# Patient Record
Sex: Male | Born: 1950 | Race: White | Hispanic: No | Marital: Single | Smoking: Never smoker
Health system: Southern US, Community
[De-identification: ages and names within clinical notes are randomized; demographics above are authoritative.]

## PROBLEM LIST (undated history)

## (undated) DIAGNOSIS — I10 Essential (primary) hypertension: Secondary | ICD-10-CM

---

## 2018-03-06 DIAGNOSIS — S20461A Insect bite (nonvenomous) of right back wall of thorax, initial encounter: Secondary | ICD-10-CM | POA: Diagnosis not present

## 2018-04-17 DIAGNOSIS — R7302 Impaired glucose tolerance (oral): Secondary | ICD-10-CM | POA: Diagnosis not present

## 2018-04-17 DIAGNOSIS — I10 Essential (primary) hypertension: Secondary | ICD-10-CM | POA: Diagnosis not present

## 2018-04-17 DIAGNOSIS — E782 Mixed hyperlipidemia: Secondary | ICD-10-CM | POA: Diagnosis not present

## 2018-04-17 DIAGNOSIS — G4733 Obstructive sleep apnea (adult) (pediatric): Secondary | ICD-10-CM | POA: Diagnosis not present

## 2018-04-17 DIAGNOSIS — Z23 Encounter for immunization: Secondary | ICD-10-CM | POA: Diagnosis not present

## 2018-04-21 DIAGNOSIS — E782 Mixed hyperlipidemia: Secondary | ICD-10-CM | POA: Diagnosis not present

## 2018-04-21 DIAGNOSIS — R7302 Impaired glucose tolerance (oral): Secondary | ICD-10-CM | POA: Diagnosis not present

## 2018-05-12 DIAGNOSIS — R7989 Other specified abnormal findings of blood chemistry: Secondary | ICD-10-CM | POA: Diagnosis not present

## 2018-08-08 ENCOUNTER — Emergency Department: Payer: Medicare HMO

## 2018-08-08 DIAGNOSIS — N452 Orchitis: Secondary | ICD-10-CM | POA: Diagnosis not present

## 2018-08-08 DIAGNOSIS — A419 Sepsis, unspecified organism: Secondary | ICD-10-CM | POA: Diagnosis not present

## 2018-08-08 DIAGNOSIS — N453 Epididymo-orchitis: Secondary | ICD-10-CM | POA: Diagnosis present

## 2018-08-08 DIAGNOSIS — N390 Urinary tract infection, site not specified: Secondary | ICD-10-CM | POA: Diagnosis present

## 2018-08-08 DIAGNOSIS — R310 Gross hematuria: Secondary | ICD-10-CM | POA: Diagnosis present

## 2018-08-08 DIAGNOSIS — Z79899 Other long term (current) drug therapy: Secondary | ICD-10-CM

## 2018-08-08 DIAGNOSIS — N433 Hydrocele, unspecified: Secondary | ICD-10-CM | POA: Diagnosis not present

## 2018-08-08 DIAGNOSIS — N5089 Other specified disorders of the male genital organs: Secondary | ICD-10-CM | POA: Diagnosis present

## 2018-08-08 DIAGNOSIS — E785 Hyperlipidemia, unspecified: Secondary | ICD-10-CM | POA: Diagnosis present

## 2018-08-08 DIAGNOSIS — R319 Hematuria, unspecified: Secondary | ICD-10-CM | POA: Diagnosis not present

## 2018-08-08 DIAGNOSIS — I10 Essential (primary) hypertension: Secondary | ICD-10-CM | POA: Diagnosis present

## 2018-08-08 LAB — COMPREHENSIVE METABOLIC PANEL
ALK PHOS: 56 U/L (ref 38–126)
ALT: 16 U/L (ref 0–44)
AST: 21 U/L (ref 15–41)
Albumin: 4.3 g/dL (ref 3.5–5.0)
Anion gap: 8 (ref 5–15)
BILIRUBIN TOTAL: 1 mg/dL (ref 0.3–1.2)
BUN: 19 mg/dL (ref 8–23)
CO2: 28 mmol/L (ref 22–32)
CREATININE: 1.48 mg/dL — AB (ref 0.61–1.24)
Calcium: 9.6 mg/dL (ref 8.9–10.3)
Chloride: 99 mmol/L (ref 98–111)
GFR calc non Af Amer: 48 mL/min — ABNORMAL LOW (ref >60)
GFR, EST AFRICAN AMERICAN: 56 mL/min — AB (ref >60)
GLUCOSE: 149 mg/dL — AB (ref 70–99)
Potassium: 3.9 mmol/L (ref 3.5–5.1)
SODIUM: 135 mmol/L (ref 135–145)
TOTAL PROTEIN: 7.5 g/dL (ref 6.5–8.1)

## 2018-08-08 LAB — URINALYSIS, COMPLETE (UACMP) WITH MICROSCOPIC
Bilirubin Urine: NEGATIVE
Glucose, UA: NEGATIVE mg/dL
Ketones, ur: NEGATIVE mg/dL
NITRITE: NEGATIVE
PH: 6 (ref 5.0–8.0)
Protein, ur: NEGATIVE mg/dL
Specific Gravity, Urine: 1.013 (ref 1.005–1.030)

## 2018-08-08 LAB — CBC
HCT: 43.6 % (ref 40.0–52.0)
Hemoglobin: 15.1 g/dL (ref 13.0–18.0)
MCH: 29.9 pg (ref 26.0–34.0)
MCHC: 34.6 g/dL (ref 32.0–36.0)
MCV: 86.5 fL (ref 80.0–100.0)
PLATELETS: 236 10*3/uL (ref 150–440)
RBC: 5.04 MIL/uL (ref 4.40–5.90)
RDW: 14 % (ref 11.5–14.5)
WBC: 16.4 10*3/uL — ABNORMAL HIGH (ref 3.8–10.6)

## 2018-08-08 LAB — LIPASE, BLOOD: Lipase: 30 U/L (ref 11–51)

## 2018-08-08 MED ORDER — OXYCODONE-ACETAMINOPHEN 5-325 MG PO TABS
1.0000 | ORAL_TABLET | Freq: Once | ORAL | Status: AC
  Administered 2018-08-08: 1 via ORAL
  Filled 2018-08-08: qty 1

## 2018-08-08 NOTE — ED Triage Notes (Signed)
Pt c/o hematuria and right scrotum pain x1 day. Pt denies dysuria and denies frequency.

## 2018-08-09 ENCOUNTER — Other Ambulatory Visit: Payer: Self-pay

## 2018-08-09 ENCOUNTER — Inpatient Hospital Stay
Admission: EM | Admit: 2018-08-09 | Discharge: 2018-08-10 | DRG: 872 | Disposition: A | Discharge status: Home / Self Care | Payer: Medicare HMO | Attending: Specialist | Admitting: Specialist

## 2018-08-09 DIAGNOSIS — N452 Orchitis: Secondary | ICD-10-CM | POA: Diagnosis not present

## 2018-08-09 DIAGNOSIS — N5089 Other specified disorders of the male genital organs: Secondary | ICD-10-CM

## 2018-08-09 DIAGNOSIS — E785 Hyperlipidemia, unspecified: Secondary | ICD-10-CM | POA: Diagnosis not present

## 2018-08-09 DIAGNOSIS — N390 Urinary tract infection, site not specified: Secondary | ICD-10-CM | POA: Diagnosis present

## 2018-08-09 DIAGNOSIS — Z79899 Other long term (current) drug therapy: Secondary | ICD-10-CM | POA: Diagnosis not present

## 2018-08-09 DIAGNOSIS — N431 Infected hydrocele: Secondary | ICD-10-CM

## 2018-08-09 DIAGNOSIS — N433 Hydrocele, unspecified: Secondary | ICD-10-CM | POA: Diagnosis present

## 2018-08-09 DIAGNOSIS — N453 Epididymo-orchitis: Secondary | ICD-10-CM | POA: Diagnosis present

## 2018-08-09 DIAGNOSIS — R310 Gross hematuria: Secondary | ICD-10-CM | POA: Diagnosis present

## 2018-08-09 DIAGNOSIS — I10 Essential (primary) hypertension: Secondary | ICD-10-CM | POA: Diagnosis not present

## 2018-08-09 DIAGNOSIS — A419 Sepsis, unspecified organism: Secondary | ICD-10-CM | POA: Diagnosis not present

## 2018-08-09 HISTORY — DX: Essential (primary) hypertension: I10

## 2018-08-09 LAB — LACTIC ACID, PLASMA
Lactic Acid, Venous: 1.3 mmol/L (ref 0.5–1.9)
Lactic Acid, Venous: 1.2 mmol/L (ref 0.5–1.9)

## 2018-08-09 LAB — MRSA PCR SCREENING: MRSA by PCR: NEGATIVE

## 2018-08-09 LAB — HEMOGLOBIN A1C
Hgb A1c MFr Bld: 6.1 % — ABNORMAL HIGH (ref 4.8–5.6)
Mean Plasma Glucose: 128.37 mg/dL

## 2018-08-09 LAB — TSH: TSH: 0.952 u[IU]/mL (ref 0.350–4.500)

## 2018-08-09 MED ORDER — SODIUM CHLORIDE 0.9 % IV SOLN
2.0000 g | INTRAVENOUS | Status: DC
  Administered 2018-08-09: 2 g via INTRAVENOUS
  Filled 2018-08-09 (×2): qty 20

## 2018-08-09 MED ORDER — HYDROCHLOROTHIAZIDE 25 MG PO TABS: 25.0000 mg | ORAL_TABLET | Freq: Every day | ORAL | Status: DC

## 2018-08-09 MED ORDER — VANCOMYCIN HCL IN DEXTROSE 1-5 GM/200ML-% IV SOLN
1000.0000 mg | Freq: Two times a day (BID) | INTRAVENOUS | Status: DC
  Filled 2018-08-09 (×2): qty 200

## 2018-08-09 MED ORDER — SODIUM CHLORIDE 0.9 % IV BOLUS
1000.0000 mL | Freq: Once | INTRAVENOUS | Status: AC
  Administered 2018-08-09: 1000 mL via INTRAVENOUS

## 2018-08-09 MED ORDER — MORPHINE SULFATE (PF) 4 MG/ML IV SOLN
4.0000 mg | Freq: Once | INTRAVENOUS | Status: AC
  Administered 2018-08-09: 4 mg via INTRAVENOUS
  Filled 2018-08-09: qty 1

## 2018-08-09 MED ORDER — ONDANSETRON HCL 4 MG/2ML IJ SOLN
4.0000 mg | Freq: Once | INTRAMUSCULAR | Status: AC
  Administered 2018-08-09: 4 mg via INTRAVENOUS
  Filled 2018-08-09: qty 2

## 2018-08-09 MED ORDER — ENOXAPARIN SODIUM 40 MG/0.4ML ~~LOC~~ SOLN
40.0000 mg | SUBCUTANEOUS | Status: DC
  Filled 2018-08-09: qty 0.4

## 2018-08-09 MED ORDER — VANCOMYCIN HCL IN DEXTROSE 1-5 GM/200ML-% IV SOLN
1000.0000 mg | Freq: Once | INTRAVENOUS | Status: AC
  Administered 2018-08-09: 1000 mg via INTRAVENOUS
  Filled 2018-08-09: qty 200

## 2018-08-09 MED ORDER — ONDANSETRON HCL 4 MG/2ML IJ SOLN: 4.0000 mg | Freq: Four times a day (QID) | INTRAMUSCULAR | Status: DC | PRN

## 2018-08-09 MED ORDER — ACETAMINOPHEN 325 MG PO TABS
650.0000 mg | ORAL_TABLET | Freq: Four times a day (QID) | ORAL | Status: DC | PRN
  Administered 2018-08-09: 21:00:00 650 mg via ORAL
  Filled 2018-08-09: qty 2

## 2018-08-09 MED ORDER — HYDROMORPHONE HCL 1 MG/ML IJ SOLN
0.5000 mg | INTRAMUSCULAR | Status: DC | PRN
  Administered 2018-08-09: 0.5 mg via INTRAVENOUS
  Filled 2018-08-09: qty 1

## 2018-08-09 MED ORDER — LOSARTAN POTASSIUM-HCTZ 100-25 MG PO TABS: 1.0000 | ORAL_TABLET | Freq: Every day | ORAL | Status: DC

## 2018-08-09 MED ORDER — ACETAMINOPHEN 650 MG RE SUPP: 650.0000 mg | Freq: Four times a day (QID) | RECTAL | Status: DC | PRN

## 2018-08-09 MED ORDER — DOCUSATE SODIUM 100 MG PO CAPS
100.0000 mg | ORAL_CAPSULE | Freq: Two times a day (BID) | ORAL | Status: DC
  Administered 2018-08-09 – 2018-08-10 (×3): 100 mg via ORAL
  Filled 2018-08-09 (×3): qty 1

## 2018-08-09 MED ORDER — ROSUVASTATIN CALCIUM 10 MG PO TABS
5.0000 mg | ORAL_TABLET | Freq: Every day | ORAL | Status: DC
  Administered 2018-08-09: 5 mg via ORAL
  Filled 2018-08-09: qty 1

## 2018-08-09 MED ORDER — LOSARTAN POTASSIUM 50 MG PO TABS: 100.0000 mg | ORAL_TABLET | Freq: Every day | ORAL | Status: DC

## 2018-08-09 MED ORDER — LEVOFLOXACIN IN D5W 500 MG/100ML IV SOLN
500.0000 mg | INTRAVENOUS | Status: DC
  Administered 2018-08-09 – 2018-08-10 (×2): 500 mg via INTRAVENOUS
  Filled 2018-08-09 (×2): qty 100

## 2018-08-09 MED ORDER — ONDANSETRON HCL 4 MG PO TABS: 4.0000 mg | ORAL_TABLET | Freq: Four times a day (QID) | ORAL | Status: DC | PRN

## 2018-08-09 NOTE — ED Provider Notes (Signed)
[facility] Emergency Department Provider Note ____________________________________________   First MD Initiated Contact with Patient 08/09/18 0157     (approximate)  I have reviewed the triage vital signs and the nursing notes.   HISTORY  Chief Complaint Groin Swelling and Hematuria    HPI Tom [name] is a 67 y.o. male who presents with hematuria and scrotal swelling over the last 2 to 3 days.  Patient reports initially he had acute onset of hematuria, and then gradually developed right-sided scrotal swelling and pain which has been worsening.  He denies dysuria, flank pain, or vomiting.  No prior history of similar symptoms.  No known trauma to the scrotum.  The patient states that he has not been sexually active in some time.  No past medical history on file.  There are no active problems to display for this patient.     Prior to Admission medications   Medication Sig Start Date End Date Taking? Authorizing Provider  losartan-hydrochlorothiazide (HYZAAR) 100-25 MG tablet Take 1 tablet by mouth daily. 07/12/18  Yes [provider]  rosuvastatin (CRESTOR) 5 MG tablet Take 5 mg by mouth daily. 07/23/18  Yes [provider]    Allergies Patient has no known allergies.  No family history on file.  Social History Social History   Tobacco Use  . Smoking status: Not on file  Substance Use Topics  . Alcohol use: Not on file  . Drug use: Not on file    Review of Systems  Constitutional: Positive for fever. Eyes: No redness. ENT: No sore throat. Cardiovascular: Denies chest pain. Respiratory: Denies shortness of breath. Gastrointestinal: No vomiting.  Genitourinary: Positive for hematuria.  Musculoskeletal: Negative for back pain. Skin: Negative for rash. Neurological: Negative for headache.   ____________________________________________   PHYSICAL EXAM:  VITAL SIGNS: ED Triage Vitals  Enc Vitals Group   BP 08/08/18 2139 128/76     Pulse Rate 08/08/18 2139 (!) 119     Resp 08/08/18 2139 18     Temp 08/08/18 2139 99.3 F (37.4 C)     Temp Source 08/08/18 2139 Oral     SpO2 08/08/18 2139 100 %     Weight 08/08/18 2139 206 lb (93.4 kg)     Height 08/08/18 2139 5\' 9"  (1.753 m)     Head Circumference --      Peak Flow --      Pain Score 08/09/18 0208 7     Pain Loc --      Pain Edu? --      Excl. in GC? --     Constitutional: Alert and oriented.  Relatively well appearing and in no acute distress. Eyes: Conjunctivae are normal.  Head: Atraumatic. Nose: No congestion/rhinnorhea. Mouth/Throat: Mucous membranes are slightly dry.   Neck: Normal range of motion.  Cardiovascular: Tachycardic, regular rhythm.  Good peripheral circulation. Respiratory: Normal respiratory effort.  No retractions. Gastrointestinal: Soft and nontender. No distention.  Genitourinary: Scrotal swelling R>L with some induration and tenderness.  Inguinal area, inner thigh, and perineum with no erythema, induration, swelling, or warmth. Musculoskeletal:  Extremities warm and well perfused.  Neurologic:  Normal speech and language. No gross focal neurologic deficits are appreciated.  Skin:  Skin is warm and dry. No rash noted. Psychiatric: Mood and affect are normal. Speech and behavior are normal.  ____________________________________________   LABS (all labs ordered are listed, but only abnormal results are displayed)  Labs Reviewed  COMPREHENSIVE METABOLIC PANEL - Abnormal; Notable for the  following components:      Result Value   Glucose, Bld 149 (*)    Creatinine, Ser 1.48 (*)    GFR calc non Af Amer 48 (*)    GFR calc Af Amer 56 (*)    All other components within normal limits  CBC - Abnormal; Notable for the following components:   WBC 16.4 (*)    All other components within normal limits  URINALYSIS, COMPLETE (UACMP) WITH MICROSCOPIC - Abnormal; Notable for the following components:   Color, Urine  YELLOW (*)    APPearance CLEAR (*)    Hgb urine dipstick SMALL (*)    Leukocytes, UA MODERATE (*)    Bacteria, UA RARE (*)    All other components within normal limits  CULTURE, BLOOD (ROUTINE X 2)  CULTURE, BLOOD (ROUTINE X 2)  LIPASE, BLOOD  LACTIC ACID, PLASMA  LACTIC ACID, PLASMA   ____________________________________________  EKG   ____________________________________________  RADIOLOGY  US scrotum: Orchitis with right-sided complex scrotal fluid, possibly pyocele  ____________________________________________   PROCEDURES  Procedure(s) performed: No  Procedures  Critical Care performed: No ____________________________________________   INITIAL IMPRESSION / ASSESSMENT AND PLAN / ED COURSE  Pertinent labs & imaging results that were available during my care of the patient were reviewed by me and considered in my medical decision making (see chart for details).  67 year old male with prior history of congenital urologic abnormality (stricture?) but no BPH or other active urological problems presents with hematuria over the last 2 days, with scrotal swelling and pain mainly on the right side.  No prior history of the symptoms.  On exam, the patient is slightly uncomfortable but relatively well-appearing, he has a borderline temperature and is somewhat tachycardic, but other vital signs are normal.  He has scrotal swelling and tenderness and induration on the right side.   Ultrasound obtained from triage shows findings consistent with orchitis, but also with appearance of possible pyocele on the right.  On exam, there is no evidence of involvement of the rest of the scrotum, the inguinal area or perineum.  There is no evidence of Fournier's gangrene or other necrotizing soft tissue infection.  I consulted Dr. Richardo Hanks from urology.  He advises that based on the patient's clinical picture and presentation, there is no indication for emergent drainage or other emergent  procedure, but agrees with my plan to start empiric antibiotics and admit.  Patient will be evaluated by urology in the morning.  ----------------------------------------- 4:16 AM on 08/09/2018 -----------------------------------------  Lactate is negative.  Vital signs are improving after fluids.  Antibiotic's have been started.  I signed the patient out to the hospitalist Dr. Sheryle Hail. ____________________________________________   FINAL CLINICAL IMPRESSION(S) / ED DIAGNOSES  Final diagnoses:  Orchitis  Pyocele      NEW MEDICATIONS STARTED DURING THIS VISIT:  New Prescriptions   No medications on file     Note:  This document was prepared using Dragon voice recognition software and may include unintentional dictation errors.    Dionne Bucy, MD 08/09/18 [phone]

## 2018-08-09 NOTE — ED Notes (Signed)
Admit Provider at bedside. 

## 2018-08-09 NOTE — H&P (Signed)
Tom [name] is an 67 y.o. male.   Chief Complaint: Hematuria HPI: The patient with past medical history of hypertension presents to the emergency department complaining of blood in his urine.  He reports that he first noticed blood in his urine 2 days ago.  Since that time his urine is cleared slightly but his testicles have begun to hurt.  The pain radiates into his inguinal/suprapubic area.  He denies fever, nausea or vomiting.  Ultrasound of his testicles in the emergency department was consistent with orchitis which prompted the emergency department staff called the hospitalist service for admission.  Past Medical History:  Diagnosis Date  . Hypertension     History reviewed. No pertinent surgical history. None  History reviewed. No pertinent family history. Hypertension in others   Social History:  reports that he has never smoked. He has never used smokeless tobacco. He reports that he drinks alcohol. His drug history is not on file.  Allergies: No Known Allergies  Medications Prior to Admission  Medication Sig Dispense Refill  . losartan-hydrochlorothiazide (HYZAAR) 100-25 MG tablet Take 1 tablet by mouth daily.    . rosuvastatin (CRESTOR) 5 MG tablet Take 5 mg by mouth daily.      Results for orders placed or performed during the hospital encounter of 08/09/18 (from the past 48 hour(s))  Lipase, blood     Status: None   Collection Time: 08/08/18  9:50 PM  Result Value Ref Range   Lipase 30 11 - 51 U/L    Comment: Performed at [facility], [address] [place], [place] [address]  Comprehensive metabolic panel     Status: Abnormal   Collection Time: 08/08/18  9:50 PM  Result Value Ref Range   Sodium 135 135 - 145 mmol/L   Potassium 3.9 3.5 - 5.1 mmol/L   Chloride 99 98 - 111 mmol/L   CO2 28 22 - 32 mmol/L   Glucose, Bld 149 (H) 70 - 99 mg/dL   BUN 19 8 - 23 mg/dL   Creatinine, Ser 1.48 (H) 0.61 - 1.24 mg/dL   Calcium 9.6 8.9 - 10.3 mg/dL   Total  Protein 7.5 6.5 - 8.1 g/dL   Albumin 4.3 3.5 - 5.0 g/dL   AST 21 15 - 41 U/L   ALT 16 0 - 44 U/L   Alkaline Phosphatase 56 38 - 126 U/L   Total Bilirubin 1.0 0.3 - 1.2 mg/dL   GFR calc non Af Amer 48 (L) >60 mL/min   GFR calc Af Amer 56 (L) >60 mL/min    Comment: (NOTE) The eGFR has been calculated using the CKD EPI equation. This calculation has not been validated in all clinical situations. eGFR's persistently <60 mL/min signify possible Chronic Kidney Disease.    Anion gap 8 5 - 15    Comment: Performed at [facility], [address] [place], [place] [address]  CBC     Status: Abnormal   Collection Time: 08/08/18  9:50 PM  Result Value Ref Range   WBC 16.4 (H) 3.8 - 10.6 K/uL   RBC 5.04 4.40 - 5.90 MIL/uL   Hemoglobin 15.1 13.0 - 18.0 g/dL   HCT 43.6 40.0 - 52.0 %   MCV 86.5 80.0 - 100.0 fL   MCH 29.9 26.0 - 34.0 pg   MCHC 34.6 32.0 - 36.0 g/dL   RDW 14.0 11.5 - 14.5 %   Platelets 236 150 - 440 K/uL    Comment: Performed at [facility],  [place], [place] [address]  Urinalysis, Complete w Microscopic     Status: Abnormal   Collection Time: 08/08/18  9:54 PM  Result Value Ref Range   Color, Urine YELLOW (A) YELLOW   APPearance CLEAR (A) CLEAR   Specific Gravity, Urine 1.013 1.005 - 1.030   pH 6.0 5.0 - 8.0   Glucose, UA NEGATIVE NEGATIVE mg/dL   Hgb urine dipstick SMALL (A) NEGATIVE   Bilirubin Urine NEGATIVE NEGATIVE   Ketones, ur NEGATIVE NEGATIVE mg/dL   Protein, ur NEGATIVE NEGATIVE mg/dL   Nitrite NEGATIVE NEGATIVE   Leukocytes, UA MODERATE (A) NEGATIVE   RBC / HPF 0-5 0 - 5 RBC/hpf   WBC, UA 21-50 0 - 5 WBC/hpf   Bacteria, UA RARE (A) NONE SEEN   Squamous Epithelial / LPF 0-5 0 - 5   Mucus PRESENT     Comment: Performed at [facility], [address] [place], [place] [address]  Lactic acid, plasma     Status: None   Collection Time: 08/09/18  2:25 AM  Result Value Ref Range   Lactic Acid, Venous 1.3 0.5 - 1.9  mmol/L    Comment: Performed at [facility], [address] [place], [place] [address]  Lactic acid, plasma     Status: None   Collection Time: 08/09/18  5:48 AM  Result Value Ref Range   Lactic Acid, Venous 1.2 0.5 - 1.9 mmol/L    Comment: Performed at [facility], [address] [place], [place] [address]   US Scrotum W/doppler  Result Date: 08/08/2018 CLINICAL DATA:  RIGHT testicular swelling and pain for 1 day, gross hematuria yesterday EXAM: SCROTAL ULTRASOUND DOPPLER ULTRASOUND OF THE TESTICLES TECHNIQUE: Complete ultrasound examination of the testicles, epididymis, and other scrotal structures was performed. Color and spectral Doppler ultrasound were also utilized to evaluate blood flow to the testicles. COMPARISON:  None FINDINGS: Right testicle Measurements: 4.8 x 3.5 x 3.4 cm. Minimally heterogeneous. No focal mass lesion or calcification. Internal blood flow present on color Doppler imaging, question mildly hypervascular. Left testicle Measurements: 4.4 x 2.3 x 3.1 cm. Normal morphology without mass. Two tiny calcifications noted. Internal blood flow present on color Doppler imaging question mildly hypervascular Right epididymis:  Normal in size and appearance. Left epididymis:  Normal in size and appearance. Hydrocele: Small simple LEFT hydrocele. Large complicated RIGHT hydrocele containing multiple septations and scattered internal echogenicity/debris, could represent pyocele or hematocele. Probable 4 mm calcification within X RIGHT hydrocele inferiorly. Varicocele:  LEFT varicocele present. No RIGHT varicocele. Pulsed Doppler interrogation of both testes demonstrates normal low resistance arterial and venous waveforms bilaterally. IMPRESSION: No evidence of testicular mass or torsion. Mildly hypervascular appearing testes bilaterally which could reflect a orchitis. Complicated RIGHT hydrocele collection with multiple septations and debris raising question of a pyocele  or hematocele. LEFT varicocele present. Electronically Signed   By: Lavonia Dana M.D.   On: 08/08/2018 23:48    Review of Systems  Constitutional: Negative for chills and fever.  HENT: Negative for sore throat and tinnitus.   Eyes: Negative for blurred vision and redness.  Respiratory: Negative for cough and shortness of breath.   Cardiovascular: Negative for chest pain, palpitations, orthopnea and PND.  Gastrointestinal: Negative for abdominal pain, diarrhea, nausea and vomiting.  Genitourinary: Positive for hematuria. Negative for dysuria, frequency and urgency.  Musculoskeletal: Negative for joint pain and myalgias.  Skin: Negative for rash.       No lesions  Neurological: Negative  for speech change, focal weakness and weakness.  Endo/Heme/Allergies: Does not bruise/bleed easily.       No temperature intolerance  Psychiatric/Behavioral: Negative for depression and suicidal ideas.    Blood pressure 103/73, pulse 97, temperature 98.4 F (36.9 C), temperature source Oral, resp. rate 19, height _0  (1.753 m), weight 99.6 kg, SpO2 97 %. Physical Exam  Vitals reviewed. Constitutional: He is oriented to person, place, and time. He appears well-developed and well-nourished. No distress.  HENT:  Head: Normocephalic and atraumatic.  Mouth/Throat: Oropharynx is clear and moist.  Eyes: Pupils are equal, round, and reactive to light. Conjunctivae and EOM are normal. No scleral icterus.  Neck: Normal range of motion. Neck supple. No JVD present. No tracheal deviation present. No thyromegaly present.  Cardiovascular: Normal rate, regular rhythm and normal heart sounds. Exam reveals no gallop and no friction rub.  No murmur heard. Respiratory: Effort normal and breath sounds normal. No respiratory distress.  GI: Soft. Bowel sounds are normal. He exhibits no distension. There is no tenderness.  Genitourinary:  Genitourinary Comments: Deferred  Musculoskeletal: Normal range of motion. He  exhibits no edema.  Lymphadenopathy:    He has no cervical adenopathy.  Neurological: He is alert and oriented to person, place, and time. No cranial nerve deficit.  Skin: Skin is warm and dry. No rash noted. No erythema.  Psychiatric: He has a normal mood and affect. His behavior is normal. Judgment and thought content normal.     Assessment/Plan This is a 67 year old male admitted for orchitis. 1.  Orchitis: Etiology viral versus bacterial.  Currently on vancomycin and ceftriaxone.  No indication of Fournier's gangrene. Patient may also have a small pyocele.  Consult urology. 2.  Sepsis: The patient meets criteria via tachycardia and leukocytosis.  He is hemodynamically stable.  Follow blood cultures for growth and sensitivities. 3.  Hypertension: Controlled; continue hydrochlorothiazide and losartan 4.  Lipidemia: Continue statin therapy 5.  DVT prophylaxis: Ambulation 6.  GI prophylaxis: None The patient is a full code.  Time spent on admission orders and patient care approximately 45 minutes  Harrie Foreman, MD 08/09/2018, 6:37 AM

## 2018-08-09 NOTE — Progress Notes (Signed)
Pharmacy Antibiotic Note  Tom [name] is a 67 y.o. male admitted on 08/09/2018 with cellulitis.  Pharmacy has been consulted for vancomycin dosing. Patient received vanc 1g IV x 1 in ED @ 0400  Plan: Will continue vanc 1g IV q12h w/ 6 hour stack  Will draw vanc trough 09/12 @ 2100 prior to 4th dose. Patient is also on ceftriaxone 2g IV daily.  Ke 0.0510 T1/2 13.5 ~ 12 hrs Goal trough 15 - 20 mcg/mL  Height: 5\' 9"  (175.3 cm) Weight: 206 lb (93.4 kg) IBW/kg (Calculated) : 70.7  Temp (24hrs), Avg:99.3 F (37.4 C), Min:99.3 F (37.4 C), Max:99.3 F (37.4 C)  Recent Labs  Lab 08/08/18 2150 08/09/18 0225  WBC 16.4*  --   CREATININE 1.48*  --   LATICACIDVEN  --  1.3    Estimated Creatinine Clearance: 56.2 mL/min (A) (by C-G formula based on SCr of 1.48 mg/dL (H)).    No Known Allergies  Thank you for allowing pharmacy to be a part of this patient's care.  Tom [name], PharmD, BCPS Clinical Pharmacist 08/09/2018

## 2018-08-09 NOTE — Progress Notes (Signed)
[facility] [place] [facility]   PATIENT NAME: Tom [name]    MR#:  [id number]  DATE OF BIRTH:  [birth year]  SUBJECTIVE:   Patient presented to the hospital due to right groin swelling and redness and noted to have orchitis.  Feels much better since being on some IV antibiotics.  Seen by urology and no plans for surgical intervention.  REVIEW OF SYSTEMS:    Review of Systems  Constitutional: Negative for chills and fever.  HENT: Negative for congestion and tinnitus.   Eyes: Negative for blurred vision and double vision.  Respiratory: Negative for cough, shortness of breath and wheezing.   Cardiovascular: Negative for chest pain, orthopnea and PND.  Gastrointestinal: Negative for abdominal pain, diarrhea, nausea and vomiting.  Genitourinary: Negative for dysuria and hematuria.  Neurological: Negative for dizziness, sensory change and focal weakness.  All other systems reviewed and are negative.   Nutrition: Heart Healthy Tolerating Diet: Yes Tolerating PT: Ambulatory  DRUG ALLERGIES:  No Known Allergies  VITALS:  Blood pressure (!) 95/59, pulse (!) 101, temperature 98.7 F (37.1 C), temperature source Oral, resp. rate 19, height 5\' 9"  (1.753 m), weight 99.6 kg, SpO2 97 %.  PHYSICAL EXAMINATION:   Physical Exam  GENERAL:  67 y.o.-year-old patient lying in bed in no acute distress.  EYES: Pupils equal, round, reactive to light and accommodation. No scleral icterus. Extraocular muscles intact.  HEENT: Head atraumatic, normocephalic. Oropharynx and nasopharynx clear.  NECK:  Supple, no jugular venous distention. No thyroid enlargement, no tenderness.  LUNGS: Normal breath sounds bilaterally, no wheezing, rales, rhonchi. No use of accessory muscles of respiration.  CARDIOVASCULAR: S1, S2 normal. No murmurs, rubs, or gallops.  ABDOMEN: Soft, nontender, nondistended. Bowel sounds present. No organomegaly or mass.  EXTREMITIES: No cyanosis, clubbing or  edema b/l.    NEUROLOGIC: Cranial nerves II through XII are intact. No focal Motor or sensory deficits b/l.   PSYCHIATRIC: The patient is alert and oriented x 3.  SKIN: No obvious rash, lesion, or ulcer.   GU - Scrotal redness and swelling.  Right scrotal swelling > left. No penile discharge.    LABORATORY PANEL:   CBC Recent Labs  Lab 08/08/18 2150  WBC 16.4*  HGB 15.1  HCT 43.6  PLT 236   ------------------------------------------------------------------------------------------------------------------  Chemistries  Recent Labs  Lab 08/08/18 2150  NA 135  K 3.9  CL 99  CO2 28  GLUCOSE 149*  BUN 19  CREATININE 1.48*  CALCIUM 9.6  AST 21  ALT 16  ALKPHOS 56  BILITOT 1.0   ------------------------------------------------------------------------------------------------------------------  Cardiac Enzymes No results for input(s): TROPONINI in the last 168 hours. ------------------------------------------------------------------------------------------------------------------  RADIOLOGY:  US Scrotum W/doppler  Result Date: 08/08/2018 CLINICAL DATA:  RIGHT testicular swelling and pain for 1 day, gross hematuria yesterday EXAM: SCROTAL ULTRASOUND DOPPLER ULTRASOUND OF THE TESTICLES TECHNIQUE: Complete ultrasound examination of the testicles, epididymis, and other scrotal structures was performed. Color and spectral Doppler ultrasound were also utilized to evaluate blood flow to the testicles. COMPARISON:  None FINDINGS: Right testicle Measurements: 4.8 x 3.5 x 3.4 cm. Minimally heterogeneous. No focal mass lesion or calcification. Internal blood flow present on color Doppler imaging, question mildly hypervascular. Left testicle Measurements: 4.4 x 2.3 x 3.1 cm. Normal morphology without mass. Two tiny calcifications noted. Internal blood flow present on color Doppler imaging question mildly hypervascular Right epididymis:  Normal in size and appearance. Left epididymis:  Normal  in size and appearance. Hydrocele: Small simple LEFT hydrocele.  Large complicated RIGHT hydrocele containing multiple septations and scattered internal echogenicity/debris, could represent pyocele or hematocele. Probable 4 mm calcification within X RIGHT hydrocele inferiorly. Varicocele:  LEFT varicocele present. No RIGHT varicocele. Pulsed Doppler interrogation of both testes demonstrates normal low resistance arterial and venous waveforms bilaterally. IMPRESSION: No evidence of testicular mass or torsion. Mildly hypervascular appearing testes bilaterally which could reflect a orchitis. Complicated RIGHT hydrocele collection with multiple septations and debris raising question of a pyocele or hematocele. LEFT varicocele present. Electronically Signed   By: Ulyses Southward M.D.   On: 08/08/2018 23:48     ASSESSMENT AND PLAN:   67 year old male with hx of HTN who presented to the hospital due to scrotal pain and redness.  1.  Sepsis-patient meets criteria given tachycardia, leukocytosis and scrotal ultrasound findings suggestive of acute orchitis. - Initially patient was on broad-spectrum IV antibiotics with vancomycin, ceftriaxone but will narrow down to just IV Levaquin for now.  Follow cultures, follow hemodynamics.  2.  Orchitis- source of patient's sepsis.  Seen by urology and no plans for surgical intervention. - Placed on IV Levaquin and will continue to monitor. -Afebrile, hemodynamically stable.  3.  Leukocytosis-secondary to the underlying orchitis/sepsis.  Follow with IV Antibiotic therapy.  4.  Essential hypertension-continue losartan/HCTZ.  5.  Hyperlipidemia-continue Crestor.  Possible discharge home tomorrow if improving.   All the records are reviewed and case discussed with Care Management/Social Worker. Management plans discussed with the patient, family and they are in agreement.  CODE STATUS: Full code  DVT Prophylaxis: Lovenox  TOTAL TIME TAKING CARE OF THIS PATIENT: 30  minutes.   POSSIBLE D/C IN 1-2 DAYS, DEPENDING ON CLINICAL CONDITION.   Houston Siren M.D on 08/09/2018 at 12:58 PM  Between 7am to 6pm - Pager - [phone]  After 6pm go to [email or web address] - Social research officer, government  [facility]  Office  [phone]  CC: Primary care physician; Marina Goodell, MD

## 2018-08-09 NOTE — Consult Note (Signed)
Urology Consult  I have been asked to see the patient by Dr. Sheryle Hail, for evaluation and management of scrotal pain/swelling.  Chief Complaint: Scrotal pain  History of Present Illness: Tom [name] is a 67 y.o. year old who on 08/07/2018 had onset of gross hematuria associated with mild dysuria.  He increased his hydration with resolution of his hematuria however yesterday he noted onset of right hemiscrotal pain and gradual swelling.  He had subjective fever without chills.  Denied nausea or vomiting.  Due to increased pain he was seen in the ED last night.  Urinalysis did show pyuria.  A scrotal ultrasound was performed which showed hypervascularity of the right testis and a right hydrocele with septations.  He denies previous history of hematuria, UTI or epididymo-orchitis.  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  He has had no recurrent strictures and states he has been voiding with an excellent stream and has no bothersome lower urinary tract symptoms until onset of his hematuria.  Past Medical History:  Diagnosis Date  . Hypertension     History reviewed. No pertinent surgical history.  Home Medications:  Current Meds  Medication Sig  . losartan-hydrochlorothiazide (HYZAAR) 100-25 MG tablet Take 1 tablet by mouth daily.  . rosuvastatin (CRESTOR) 5 MG tablet Take 5 mg by mouth daily.    Allergies: No Known Allergies  History reviewed. No pertinent family history.  Social History:  reports that he has never smoked. He has never used smokeless tobacco. He reports that he drinks alcohol. His drug history is not on file.  ROS: A complete review of systems was performed.  All systems are negative except for pertinent findings as noted.  Physical Exam:  Vital signs in last 24 hours: Temp:  [98.4 F (36.9 C)-99.3 F (37.4 C)] 98.7 F (37.1 C) (09/11 0748) Pulse Rate:  [97-119] 101 (09/11  0748) Resp:  [18-19] 19 (09/11 0605) BP: (95-128)/(59-79) 95/59 (09/11 0754) SpO2:  [92 %-100 %] 97 % (09/11 0748) Weight:  [93.4 kg-99.6 kg] 99.6 kg (09/11 0605) Constitutional:  Alert and oriented, No acute distress HEENT: [place] AT, moist mucus membranes.  Trachea midline, no masses Cardiovascular: Regular rate and rhythm, no clubbing, cyanosis, or edema. Respiratory: Normal respiratory effort, lungs clear bilaterally GI: Abdomen is soft, nontender, nondistended, no abdominal masses GU: No CVA tenderness.  Penis without lesions.  Mild scrotal erythema.  Left testis palpably normal.  The right testis and epididymis is enlarged, indurated and tender.  No fluctuance.  Moderate right hydrocele present. Skin: No rashes, bruises or suspicious lesions Lymph: No cervical or inguinal adenopathy Neurologic: Grossly intact, no focal deficits, moving all 4 extremities Psychiatric: Normal mood and affect   Laboratory Data:  Recent Labs    08/08/18 2150  WBC 16.4*  HGB 15.1  HCT 43.6   Recent Labs    08/08/18 2150  NA 135  K 3.9  CL 99  CO2 28  GLUCOSE 149*  BUN 19  CREATININE 1.48*  CALCIUM 9.6   No results for input(s): LABPT, INR in the last 72 hours. No results for input(s): LABURIN in the last 72 hours. Results for orders placed or performed during the hospital encounter of 08/09/18  Culture, blood (routine x 2)     Status: None (Preliminary result)   Collection Time: 08/09/18  2:25 AM  Result Value Ref Range Status   Specimen Description BLOOD RIGHT ANTECUBITAL  Final  Special Requests   Final    BOTTLES DRAWN AEROBIC AND ANAEROBIC Blood Culture results may not be optimal due to an excessive volume of blood received in culture bottles   Culture   Final    NO GROWTH < 12 HOURS Performed at [facility], [address] [place], [place] [address]    Report Status PENDING  Incomplete  Culture, blood (routine x 2)     Status: None (Preliminary result)   Collection  Time: 08/09/18  2:36 AM  Result Value Ref Range Status   Specimen Description BLOOD LEFT HAND  Final   Special Requests   Final    BOTTLES DRAWN AEROBIC AND ANAEROBIC Blood Culture adequate volume   Culture   Final    NO GROWTH < 12 HOURS Performed at [facility], [address] [place], [place] [address]    Report Status PENDING  Incomplete     Radiologic Imaging: US Scrotum W/doppler  Result Date: 08/08/2018 CLINICAL DATA:  RIGHT testicular swelling and pain for 1 day, gross hematuria yesterday EXAM: SCROTAL ULTRASOUND DOPPLER ULTRASOUND OF THE TESTICLES TECHNIQUE: Complete ultrasound examination of the testicles, epididymis, and other scrotal structures was performed. Color and spectral Doppler ultrasound were also utilized to evaluate blood flow to the testicles. COMPARISON:  None FINDINGS: Right testicle Measurements: 4.8 x 3.5 x 3.4 cm. Minimally heterogeneous. No focal mass lesion or calcification. Internal blood flow present on color Doppler imaging, question mildly hypervascular. Left testicle Measurements: 4.4 x 2.3 x 3.1 cm. Normal morphology without mass. Two tiny calcifications noted. Internal blood flow present on color Doppler imaging question mildly hypervascular Right epididymis:  Normal in size and appearance. Left epididymis:  Normal in size and appearance. Hydrocele: Small simple LEFT hydrocele. Large complicated RIGHT hydrocele containing multiple septations and scattered internal echogenicity/debris, could represent pyocele or hematocele. Probable 4 mm calcification within X RIGHT hydrocele inferiorly. Varicocele:  LEFT varicocele present. No RIGHT varicocele. Pulsed Doppler interrogation of both testes demonstrates normal low resistance arterial and venous waveforms bilaterally. IMPRESSION: No evidence of testicular mass or torsion. Mildly hypervascular appearing testes bilaterally which could reflect a orchitis. Complicated RIGHT hydrocele collection with multiple  septations and debris raising question of a pyocele or hematocele. LEFT varicocele present. Electronically Signed   By: Ulyses Southward M.D.   On: 08/08/2018 23:48    Impression:  67 year old male with right epididymal-orchitis.  Ultrasound was reviewed and this is consistent with a reactive hydrocele.  There is no indication for scrotal exploration or drainage at this time.  Recommendation:  Continue IV antibiotic therapy pending urine culture/sensitivities.  Will follow.  08/09/2018, 8:15 AM  Irineo Axon,  MD

## 2018-08-10 ENCOUNTER — Telehealth: Payer: Self-pay | Admitting: Urology

## 2018-08-10 LAB — CBC
HCT: 37.9 % — ABNORMAL LOW (ref 40.0–52.0)
Hemoglobin: 12.9 g/dL — ABNORMAL LOW (ref 13.0–18.0)
MCH: 29.7 pg (ref 26.0–34.0)
MCHC: 34 g/dL (ref 32.0–36.0)
MCV: 87.4 fL (ref 80.0–100.0)
Platelets: 205 10*3/uL (ref 150–440)
RBC: 4.33 MIL/uL — ABNORMAL LOW (ref 4.40–5.90)
RDW: 14 % (ref 11.5–14.5)
WBC: 16.4 10*3/uL — ABNORMAL HIGH (ref 3.8–10.6)

## 2018-08-10 LAB — CREATININE, SERUM
Creatinine, Ser: 1.35 mg/dL — ABNORMAL HIGH (ref 0.61–1.24)
GFR calc Af Amer: >60 mL/min (ref >60)
GFR, EST NON AFRICAN AMERICAN: 54 mL/min — AB (ref >60)

## 2018-08-10 MED ORDER — LEVOFLOXACIN 750 MG PO TABS: 750.0000 mg | ORAL_TABLET | Freq: Every day | ORAL | 0 refills | Status: AC

## 2018-08-10 NOTE — Discharge Summary (Signed)
[facility] [place] [place] NAME: Tom [name]    MR#:  [id number]  DATE OF BIRTH:  [birth year]  DATE OF ADMISSION:  08/09/2018 ADMITTING PHYSICIAN: Harrie Foreman, MD  DATE OF DISCHARGE: 08/10/2018  1:28 PM  PRIMARY CARE PHYSICIAN: Sofie Hartigan, MD    ADMISSION DIAGNOSIS:  Orchitis [N45.2] Testicular swelling, right [N50.89] Pyocele [N43.1]  DISCHARGE DIAGNOSIS:  Active Problems:   Orchitis   SECONDARY DIAGNOSIS:   Past Medical History:  Diagnosis Date  . Hypertension     HOSPITAL COURSE:   67 year old male with hx of HTN who presented to the hospital due to scrotal pain and redness.  1.  Sepsis-patient met criteria given tachycardia, leukocytosis and scrotal ultrasound findings suggestive of acute orchitis. - Initially patient was on broad-spectrum IV antibiotics with vancomycin, ceftriaxone but narrowed down to just IV Levaquin.  -Afebrile, hemodynamically stable.  Clinically improved and will discharge home today.    2.  Orchitis- source of patient's sepsis.  Seen by urology and no plans for surgical intervention. -Initially placed on broad-spectrum IV antibiotics and narrowed down to IV Levaquin and patient is being discharged on oral Levaquin now for additional 8 days.  Patient will follow-up with urology next week.  3.  Leukocytosis-secondary to the underlying orchitis/sepsis. - cont. Follow up as outpatient.   4.  Essential hypertension- pt. Will continue losartan/HCTZ.  5.  Hyperlipidemia- pt. Will continue Crestor  DISCHARGE CONDITIONS:   Stable.   CONSULTS OBTAINED:  Treatment Team:  Henreitta Leber, MD Abbie Sons, MD Hollice Espy, MD  DRUG ALLERGIES:  No Known Allergies  DISCHARGE MEDICATIONS:   Allergies as of 08/10/2018   No Known Allergies     Medication List    TAKE these medications   levofloxacin 750 MG tablet Commonly known as:  LEVAQUIN Take 1 tablet (750 mg total) by  mouth daily for 8 days.   losartan-hydrochlorothiazide 100-25 MG tablet Commonly known as:  HYZAAR Take 1 tablet by mouth daily.   rosuvastatin 5 MG tablet Commonly known as:  CRESTOR Take 5 mg by mouth daily.         DISCHARGE INSTRUCTIONS:   DIET:  Cardiac diet  DISCHARGE CONDITION:  Stable  ACTIVITY:  Activity as tolerated  OXYGEN:  Home Oxygen: No.   Oxygen Delivery: room air  DISCHARGE LOCATION:  home   If you experience worsening of your admission symptoms, develop shortness of breath, life threatening emergency, suicidal or homicidal thoughts you must seek medical attention immediately by calling 911 or calling your MD immediately  if symptoms less severe.  You Must read complete instructions/literature along with all the possible adverse reactions/side effects for all the Medicines you take and that have been prescribed to you. Take any new Medicines after you have completely understood and accpet all the possible adverse reactions/side effects.   Please note  You were cared for by a hospitalist during your hospital stay. If you have any questions about your discharge medications or the care you received while you were in the hospital after you are discharged, you can call the unit and asked to speak with the hospitalist on call if the hospitalist that took care of you is not available. Once you are discharged, your primary care physician will handle any further medical issues. Please note that NO REFILLS for any discharge medications will be authorized once you are discharged, as it is imperative that you return to your primary care physician (  or establish a relationship with a primary care physician if you do not have one) for your aftercare needs so that they can reassess your need for medications and monitor your lab values.     Today   No acute events overnight.  Scrotal swelling and redness has improved since yesterday.  Patient has less pain.  No problems  with urination.  Discussed with urology and will discharge home today with outpatient follow-up.  VITAL SIGNS:  Blood pressure 130/88, pulse (!) 109, temperature 99.6 F (37.6 C), temperature source Oral, resp. rate 18, height 5' 9"  (1.753 m), weight 98.2 kg, SpO2 96 %.  I/O:  No intake or output data in the 24 hours ending 08/10/18 1553  PHYSICAL EXAMINATION:   GENERAL:  67 y.o.-year-old patient lying in bed in no acute distress.  EYES: Pupils equal, round, reactive to light and accommodation. No scleral icterus. Extraocular muscles intact.  HEENT: Head atraumatic, normocephalic. Oropharynx and nasopharynx clear.  NECK:  Supple, no jugular venous distention. No thyroid enlargement, no tenderness.  LUNGS: Normal breath sounds bilaterally, no wheezing, rales, rhonchi. No use of accessory muscles of respiration.  CARDIOVASCULAR: S1, S2 normal. No murmurs, rubs, or gallops.  ABDOMEN: Soft, nontender, nondistended. Bowel sounds present. No organomegaly or mass.  EXTREMITIES: No cyanosis, clubbing or edema b/l.    NEUROLOGIC: Cranial nerves II through XII are intact. No focal Motor or sensory deficits b/l.   PSYCHIATRIC: The patient is alert and oriented x 3.  SKIN: No obvious rash, lesion, or ulcer.   GU - Scrotal redness and swelling.  Right scrotal swelling > left. No penile discharge.   DATA REVIEW:   CBC Recent Labs  Lab 08/10/18 0413  WBC 16.4*  HGB 12.9*  HCT 37.9*  PLT 205    Chemistries  Recent Labs  Lab 08/08/18 2150 08/10/18 0413  NA 135  --   K 3.9  --   CL 99  --   CO2 28  --   GLUCOSE 149*  --   BUN 19  --   CREATININE 1.48* 1.35*  CALCIUM 9.6  --   AST 21  --   ALT 16  --   ALKPHOS 56  --   BILITOT 1.0  --     Cardiac Enzymes No results for input(s): TROPONINI in the last 168 hours.  Microbiology Results  Results for orders placed or performed during the hospital encounter of 08/09/18  Culture, blood (routine x 2)     Status: None (Preliminary  result)   Collection Time: 08/09/18  2:25 AM  Result Value Ref Range Status   Specimen Description BLOOD RIGHT ANTECUBITAL  Final   Special Requests   Final    BOTTLES DRAWN AEROBIC AND ANAEROBIC Blood Culture results may not be optimal due to an excessive volume of blood received in culture bottles   Culture   Final    NO GROWTH 1 DAY Performed at [facility], [address], [place], [place] [address]    Report Status PENDING  Incomplete  Culture, blood (routine x 2)     Status: None (Preliminary result)   Collection Time: 08/09/18  2:36 AM  Result Value Ref Range Status   Specimen Description BLOOD LEFT HAND  Final   Special Requests   Final    BOTTLES DRAWN AEROBIC AND ANAEROBIC Blood Culture adequate volume   Culture   Final    NO GROWTH 1 DAY Performed at [facility], [address],  [place], [place] [address]    Report Status PENDING  Incomplete  MRSA PCR Screening     Status: None   Collection Time: 08/09/18  6:20 AM  Result Value Ref Range Status   MRSA by PCR NEGATIVE NEGATIVE Final    Comment:        The GeneXpert MRSA Assay (FDA approved for NASAL specimens only), is one component of a comprehensive MRSA colonization surveillance program. It is not intended to diagnose MRSA infection nor to guide or monitor treatment for MRSA infections. Performed at [facility], [address] [place], [place] [address]     RADIOLOGY:  US Scrotum W/doppler  Result Date: 08/08/2018 CLINICAL DATA:  RIGHT testicular swelling and pain for 1 day, gross hematuria yesterday EXAM: SCROTAL ULTRASOUND DOPPLER ULTRASOUND OF THE TESTICLES TECHNIQUE: Complete ultrasound examination of the testicles, epididymis, and other scrotal structures was performed. Color and spectral Doppler ultrasound were also utilized to evaluate blood flow to the testicles. COMPARISON:  None FINDINGS: Right testicle Measurements: 4.8 x 3.5 x 3.4 cm. Minimally heterogeneous. No  focal mass lesion or calcification. Internal blood flow present on color Doppler imaging, question mildly hypervascular. Left testicle Measurements: 4.4 x 2.3 x 3.1 cm. Normal morphology without mass. Two tiny calcifications noted. Internal blood flow present on color Doppler imaging question mildly hypervascular Right epididymis:  Normal in size and appearance. Left epididymis:  Normal in size and appearance. Hydrocele: Small simple LEFT hydrocele. Large complicated RIGHT hydrocele containing multiple septations and scattered internal echogenicity/debris, could represent pyocele or hematocele. Probable 4 mm calcification within X RIGHT hydrocele inferiorly. Varicocele:  LEFT varicocele present. No RIGHT varicocele. Pulsed Doppler interrogation of both testes demonstrates normal low resistance arterial and venous waveforms bilaterally. IMPRESSION: No evidence of testicular mass or torsion. Mildly hypervascular appearing testes bilaterally which could reflect a orchitis. Complicated RIGHT hydrocele collection with multiple septations and debris raising question of a pyocele or hematocele. LEFT varicocele present. Electronically Signed   By: Lavonia Dana M.D.   On: 08/08/2018 23:48      Management plans discussed with the patient, family and they are in agreement.  CODE STATUS:     Code Status Orders  (From admission, onward)         Start     Ordered   08/09/18 0607  Full code  Continuous     08/09/18 0606        Code Status History    This patient has a current code status but no historical code status.      TOTAL TIME TAKING CARE OF THIS PATIENT: 40 minutes.    Henreitta Leber M.D on 08/10/2018 at 3:53 PM  Between 7am to 6pm - Pager - [phone]  After 6pm go to [email or web address] - Proofreader  [facility]  Office  [phone]  CC: Primary care physician; Sofie Hartigan, MD

## 2018-08-10 NOTE — Progress Notes (Signed)
Patient received d/c instructions at bedside including one prescription. Discussed medications and educated. Volunteer took patient out in a wheelchair to meet his son in law to go home.

## 2018-08-10 NOTE — Telephone Encounter (Signed)
App made 

## 2018-08-10 NOTE — Telephone Encounter (Signed)
Pt

## 2018-08-10 NOTE — Telephone Encounter (Signed)
-----   Message from Riki AltesScott C Stoioff, MD sent at 08/10/2018 10:11 AM EDT ----- Inpatient with orchitis to be discharged today.  Schedule follow-up with myself or Carollee HerterShannon next week

## 2018-08-10 NOTE — Progress Notes (Signed)
Urology Consult Follow Up  Subjective: Feeling better with less pain/discomfort.  He was switched to IV Levaquin and started on orals this morning.  Max temp 100  Anti-infectives: Anti-infectives (From admission, onward)   Start     Dose/Rate Route Frequency Ordered Stop   08/10/18 0000  levofloxacin (LEVAQUIN) 750 MG tablet     750 mg Oral Daily 08/10/18 0920 08/18/18 2359   08/09/18 1000  vancomycin (VANCOCIN) IVPB 1000 mg/200 mL premix  Status:  Discontinued     1,000 mg 200 mL/hr over 60 Minutes Intravenous Every 12 hours 08/09/18 0505 08/09/18 0920   08/09/18 1000  levofloxacin (LEVAQUIN) IVPB 500 mg     500 mg 100 mL/hr over 60 Minutes Intravenous Every 24 hours 08/09/18 0933     08/09/18 0245  vancomycin (VANCOCIN) IVPB 1000 mg/200 mL premix     1,000 mg 200 mL/hr over 60 Minutes Intravenous  Once 08/09/18 0241 08/09/18 0504   08/09/18 0245  cefTRIAXone (ROCEPHIN) 2 g in sodium chloride 0.9 % 100 mL IVPB  Status:  Discontinued     2 g 200 mL/hr over 30 Minutes Intravenous Every 24 hours 08/09/18 0241 08/09/18 0933      Current Facility-Administered Medications  Medication Dose Route Frequency Provider Last Rate Last Dose  . acetaminophen (TYLENOL) tablet 650 mg  650 mg Oral Q6H PRN Arnaldo Natal, MD   650 mg at 08/09/18 2103   Or  . acetaminophen (TYLENOL) suppository 650 mg  650 mg Rectal Q6H PRN Arnaldo Natal, MD      . docusate sodium (COLACE) capsule 100 mg  100 mg Oral BID Arnaldo Natal, MD   100 mg at 08/10/18 0917  . enoxaparin (LOVENOX) injection 40 mg  40 mg Subcutaneous Q24H Sainani, Vivek J, MD      . losartan (COZAAR) tablet 100 mg  100 mg Oral Daily Arnaldo Natal, MD   Stopped at 08/10/18 0902   And  . hydrochlorothiazide (HYDRODIURIL) tablet 25 mg  25 mg Oral Daily Arnaldo Natal, MD   Stopped at 08/10/18 [phone]  . HYDROmorphone (DILAUDID) injection 0.5 mg  0.5 mg Intravenous Q4H PRN Cammy Copa, MD   0.5 mg at 08/09/18 2338  .  levofloxacin (LEVAQUIN) IVPB 500 mg  500 mg Intravenous Q24H Houston Siren, MD 100 mL/hr at 08/10/18 0924 500 mg at 08/10/18 0924  . ondansetron (ZOFRAN) tablet 4 mg  4 mg Oral Q6H PRN Arnaldo Natal, MD       Or  . ondansetron [facility]) injection 4 mg  4 mg Intravenous Q6H PRN Arnaldo Natal, MD      . rosuvastatin (CRESTOR) tablet 5 mg  5 mg Oral Daily Arnaldo Natal, MD   5 mg at 08/09/18 1828     Objective: Vital signs in last 24 hours: Temp:  [98.4 F (36.9 C)-100 F (37.8 C)] 99.6 F (37.6 C) (09/12 0928) Pulse Rate:  [91-109] 109 (09/12 0928) Resp:  [18-19] 18 (09/12 0928) BP: (100-130)/(72-88) 130/88 (09/12 0928) SpO2:  [91 %-99 %] 96 % (09/12 0928) Weight:  [98.2 kg] 98.2 kg (09/12 0424)  Intake/Output from previous day: 09/11 0701 - 09/12 0700 In: 335.6 [P.O.:240; IV Piggyback:95.6] Out: -  Intake/Output this shift: No intake/output data recorded.   Physical Exam: Moderate right hemiscrotal swelling with mild scrotal skin erythema no significant change except much less tender on today's exam.  Lab Results:  Recent Labs    08/08/18 2150 08/10/18 0413  WBC 16.4* 16.4*  HGB 15.1 12.9*  HCT 43.6 37.9*  PLT 236 205   BMET Recent Labs    08/08/18 2150 08/10/18 0413  NA 135  --   K 3.9  --   CL 99  --   CO2 28  --   GLUCOSE 149*  --   BUN 19  --   CREATININE 1.48* 1.35*  CALCIUM 9.6  --    Studies/Results: [place] Scrotum W/doppler  Result Date: 08/08/2018 CLINICAL DATA:  RIGHT testicular swelling and pain for 1 day, gross hematuria yesterday EXAM: SCROTAL ULTRASOUND DOPPLER ULTRASOUND OF THE TESTICLES TECHNIQUE: Complete ultrasound examination of the testicles, epididymis, and other scrotal structures was performed. Color and spectral Doppler ultrasound were also utilized to evaluate blood flow to the testicles. COMPARISON:  None FINDINGS: Right testicle Measurements: 4.8 x 3.5 x 3.4 cm. Minimally heterogeneous. No focal mass lesion or  calcification. Internal blood flow present on color Doppler imaging, question mildly hypervascular. Left testicle Measurements: 4.4 x 2.3 x 3.1 cm. Normal morphology without mass. Two tiny calcifications noted. Internal blood flow present on color Doppler imaging question mildly hypervascular Right epididymis:  Normal in size and appearance. Left epididymis:  Normal in size and appearance. Hydrocele: Small simple LEFT hydrocele. Large complicated RIGHT hydrocele containing multiple septations and scattered internal echogenicity/debris, could represent pyocele or hematocele. Probable 4 mm calcification within X RIGHT hydrocele inferiorly. Varicocele:  LEFT varicocele present. No RIGHT varicocele. Pulsed Doppler interrogation of both testes demonstrates normal low resistance arterial and venous waveforms bilaterally. IMPRESSION: No evidence of testicular mass or torsion. Mildly hypervascular appearing testes bilaterally which could reflect a orchitis. Complicated RIGHT hydrocele collection with multiple septations and debris raising question of a pyocele or hematocele. LEFT varicocele present. Electronically Signed   By: Ulyses SouthwardMark  Boles M.D.   On: 08/08/2018 23:48     Assessment: Right epididymo-orchitis; improving  Plan: Okay for discharge from urologic standpoint.  Will schedule a follow-up appointment in our office next week.  His admission urine did have pyuria and a culture was not ordered.  I ordered a culture prior to discharge to make sure he does not have a resistant bacteria but less likely since he is responding clinically.    LOS: 1 day    Riki AltesScott C Leyan Branden 08/10/2018

## 2018-08-11 LAB — URINE CULTURE: Culture: NO GROWTH

## 2018-08-14 LAB — CULTURE, BLOOD (ROUTINE X 2)
Culture: NO GROWTH
Culture: NO GROWTH
Special Requests: ADEQUATE

## 2018-08-17 ENCOUNTER — Encounter: Payer: Self-pay | Admitting: Urology

## 2018-08-17 ENCOUNTER — Ambulatory Visit (INDEPENDENT_AMBULATORY_CARE_PROVIDER_SITE_OTHER): Payer: Medicare HMO | Admitting: Urology

## 2018-08-17 VITALS — BP 135/88 | HR 106 | Ht 69.0 in | Wt 205.1 lb

## 2018-08-17 DIAGNOSIS — R7989 Other specified abnormal findings of blood chemistry: Secondary | ICD-10-CM

## 2018-08-17 DIAGNOSIS — Z8042 Family history of malignant neoplasm of prostate: Secondary | ICD-10-CM | POA: Diagnosis not present

## 2018-08-17 DIAGNOSIS — N452 Orchitis: Secondary | ICD-10-CM

## 2018-08-17 DIAGNOSIS — R310 Gross hematuria: Secondary | ICD-10-CM | POA: Diagnosis not present

## 2018-08-17 NOTE — Progress Notes (Signed)
08/17/2018 10:33 AM   Tom Loganhristopher Badeaux 04/24/1951 [id number]  Referring provider: Marina GoodellFeldpausch, Dale E, MD [address] [place], [place] [id number]  Chief Complaint  Patient presents with  . Follow-up    HPI: Patient is a 67 year old Caucasian with a history of urethral stricture and of orchitis who presents today for follow up.  He presented to the ED with the complaint of gross hematuria and groin swelling.  UA was positive for 0-5 RBC's and 21-50 WBC's.  Urine culture is negative.  WBC count was 16.4.  Serum creatinine 1.35.  He was admitted for IV antibiotics and observation.  He continued to improve clinically and was discharged on 08/10/2018.    He has had a DVIU about 20 years ago in [place].    He has one more tablet of Levaquin to take.  He has noted an 80% improvement in pain and swelling.  Patient denies any gross hematuria, dysuria or suprapubic/flank pain.  Patient denies any fevers, chills, nausea or vomiting.    His older brother was diagnosed with prostate cancer at age 65.  It was treated with radiation.    PMH: Past Medical History:  Diagnosis Date  . Hypertension     Surgical History: No past surgical history on file.  Home Medications:  Allergies as of 08/17/2018   No Known Allergies     Medication List        Accurate as of 08/17/18 10:33 AM. Always use your most recent med list.          Co Q10 200 MG Caps Take by mouth.   levofloxacin 750 MG tablet Commonly known as:  LEVAQUIN Take 1 tablet (750 mg total) by mouth daily for 8 days.   losartan-hydrochlorothiazide 100-25 MG tablet Commonly known as:  HYZAAR Take 1 tablet by mouth daily.   rosuvastatin 5 MG tablet Commonly known as:  CRESTOR Take 5 mg by mouth daily.       Allergies: No Known Allergies  Family History: No family history on file.  Social History:  reports that he has never smoked. He has never used smokeless tobacco. He reports that he drinks alcohol. His drug  history is not on file.  ROS: UROLOGY Frequent Urination?: No Hard to postpone urination?: No Burning/pain with urination?: No Get up at night to urinate?: No Leakage of urine?: No Urine stream starts and stops?: No Trouble starting stream?: No Do you have to strain to urinate?: No Blood in urine?: No Urinary tract infection?: No Sexually transmitted disease?: No Injury to kidneys or bladder?: No Painful intercourse?: No Weak stream?: No Erection problems?: No Penile pain?: No  Gastrointestinal Nausea?: No Vomiting?: No Indigestion/heartburn?: No Diarrhea?: No Constipation?: No  Constitutional Fever: No Night sweats?: No Weight loss?: No Fatigue?: No  Skin Skin rash/lesions?: No Itching?: No  Eyes Blurred vision?: No Double vision?: No  Ears/Nose/Throat Sore throat?: No Sinus problems?: No  Hematologic/Lymphatic Swollen glands?: No Easy bruising?: No  Cardiovascular Leg swelling?: No Chest pain?: No  Respiratory Cough?: No Shortness of breath?: No  Endocrine Excessive thirst?: No  Musculoskeletal Back pain?: No Joint pain?: No  Neurological Headaches?: No Dizziness?: No  Psychologic Depression?: No Anxiety?: No  Physical Exam: BP 135/88 (BP Location: Left Arm, Patient Position: Sitting, Cuff Size: Large)   Pulse (!) 106   Ht 5\' 9"  (1.753 m)   Wt 205 lb 1.6 oz (93 kg)   BMI 30.29 kg/m   Constitutional:  Well nourished. Alert  and oriented, No acute distress. HEENT: [place] AT, moist mucus membranes.  Trachea midline, no masses. Cardiovascular: No clubbing, cyanosis, or edema. Respiratory: Normal respiratory effort, no increased work of breathing. GI: Abdomen is soft, non tender, non distended, no abdominal masses. Liver and spleen not palpable.  No hernias appreciated.  Stool sample for occult testing is not indicated.   GU: No CVA tenderness.  No bladder fullness or masses.  Patient with circumcised phallus.  Urethral meatus is patent.   No penile discharge. No penile lesions or rashes. Left scrotum without lesions, cysts, rashes and/or edema.  Right scrotum with mild edema and erythema, no crepitus or fluctuance is noted .  Testicles are located scrotally bilaterally.  Right testicle is mildly tender.  No masses are appreciated in the testicles. Left epididymis are normal.  Right epididymides is slightly tender and indurated.   Rectal: Patient with  normal sphincter tone. Anus and perineum without scarring or rashes. No rectal masses are appreciated. Prostate is approximately 50 grams, could only palpate the apex, no nodules are appreciated.  Skin: No rashes, bruises or suspicious lesions. Lymph: No cervical or inguinal adenopathy. Neurologic: Grossly intact, no focal deficits, moving all 4 extremities. Psychiatric: Normal mood and affect.  Laboratory Data: Lab Results  Component Value Date   WBC 16.4 (H) 08/10/2018   HGB 12.9 (L) 08/10/2018   HCT 37.9 (L) 08/10/2018   MCV 87.4 08/10/2018   PLT 205 08/10/2018    Lab Results  Component Value Date   CREATININE 1.35 (H) 08/10/2018    No results found for: PSA  No results found for: TESTOSTERONE  Lab Results  Component Value Date   HGBA1C 6.1 (H) 08/08/2018    Lab Results  Component Value Date   TSH 0.952 08/08/2018    No results found for: CHOL, HDL, CHOLHDL, VLDL, LDLCALC  Lab Results  Component Value Date   AST 21 08/08/2018   Lab Results  Component Value Date   ALT 16 08/08/2018   No components found for: ALKALINEPHOPHATASE No components found for: BILIRUBINTOTAL  No results found for: ESTRADIOL  Urinalysis    Component Value Date/Time   COLORURINE YELLOW (A) 08/08/2018 2154   APPEARANCEUR CLEAR (A) 08/08/2018 2154   LABSPEC 1.013 08/08/2018 2154   PHURINE 6.0 08/08/2018 2154   GLUCOSEU NEGATIVE 08/08/2018 2154   HGBUR SMALL (A) 08/08/2018 2154   BILIRUBINUR NEGATIVE 08/08/2018 2154   KETONESUR NEGATIVE 08/08/2018 2154   PROTEINUR  NEGATIVE 08/08/2018 2154   NITRITE NEGATIVE 08/08/2018 2154   LEUKOCYTESUR MODERATE (A) 08/08/2018 2154    I have reviewed the labs.   Pertinent Imaging: CLINICAL DATA:  RIGHT testicular swelling and pain for 1 day, gross hematuria yesterday  EXAM: SCROTAL ULTRASOUND  DOPPLER ULTRASOUND OF THE TESTICLES  TECHNIQUE: Complete ultrasound examination of the testicles, epididymis, and other scrotal structures was performed. Color and spectral Doppler ultrasound were also utilized to evaluate blood flow to the testicles.  COMPARISON:  None  FINDINGS: Right testicle  Measurements: 4.8 x 3.5 x 3.4 cm. Minimally heterogeneous. No focal mass lesion or calcification. Internal blood flow present on color Doppler imaging, question mildly hypervascular.  Left testicle  Measurements: 4.4 x 2.3 x 3.1 cm. Normal morphology without mass. Two tiny calcifications noted. Internal blood flow present on color Doppler imaging question mildly hypervascular  Right epididymis:  Normal in size and appearance.  Left epididymis:  Normal in size and appearance.  Hydrocele: Small simple LEFT hydrocele. Large complicated RIGHT hydrocele containing multiple septations  and scattered internal echogenicity/debris, could represent pyocele or hematocele. Probable 4 mm calcification within X RIGHT hydrocele inferiorly.  Varicocele:  LEFT varicocele present. No RIGHT varicocele.  Pulsed Doppler interrogation of both testes demonstrates normal low resistance arterial and venous waveforms bilaterally.  IMPRESSION: No evidence of testicular mass or torsion.  Mildly hypervascular appearing testes bilaterally which could reflect a orchitis.  Complicated RIGHT hydrocele collection with multiple septations and debris raising question of a pyocele or hematocele.  LEFT varicocele present.   Electronically Signed   By: Ulyses Southward M.D.   On: 08/08/2018 23:48  I have independently  reviewed the films.    Assessment & Plan:    1. Orchitis Improving Patient to continue to wear supportive underwear Patient to complete antibiotic Return to the clinic in 1 month for recheck Patient is advised that if they should start to experience an increase in pain, intractable nausea and/or vomiting and/or fevers greater than 103 or shaking chills, drainage from the scrotum, increase is swelling or redness or crepitus to contact the office immediately or seek treatment in the emergency department for emergent intervention.    2. Elevated creatinine Patient return in 1 month and we will recheck a serum creatinine at that time  3. Family history of prostate cancer Patient's older brother has been diagnosed with prostate cancer and patient has no recent PSA on file When patient returns 1 month we will PSA  4. Gross hematuria Patient denies any further episodes of gross hematuria, but we will recheck a UA upon return in 1 month to make sure microscopic hematuria does not persist once infection is cleared   Return in about 1 month (around 09/16/2018) for recheck .  These notes generated with voice recognition software. I apologize for typographical errors.  Michiel Cowboy, PA-C  [facility] [address]  [address] [place], [place] [address] [phone]

## 2018-08-18 DIAGNOSIS — N452 Orchitis: Secondary | ICD-10-CM | POA: Diagnosis not present

## 2018-09-14 ENCOUNTER — Ambulatory Visit: Payer: Medicare HMO | Admitting: Urology

## 2018-09-14 ENCOUNTER — Encounter: Payer: Self-pay | Admitting: Urology

## 2018-09-14 VITALS — BP 134/89 | HR 89 | Ht 69.0 in | Wt 210.8 lb

## 2018-09-14 DIAGNOSIS — N452 Orchitis: Secondary | ICD-10-CM | POA: Diagnosis not present

## 2018-09-14 DIAGNOSIS — R310 Gross hematuria: Secondary | ICD-10-CM | POA: Diagnosis not present

## 2018-09-14 NOTE — Progress Notes (Signed)
09/14/2018 10:51 AM   Tom [name] 1951/09/20 [id number]  Referring provider: Marina Goodell, MD [address] [place], [place] [address]  Chief Complaint  Patient presents with  . Follow-up    HPI: Patient is a 67 year old Caucasian with a history of orchitis who presents today for follow up.  Background history Patient is a 67 year old Caucasian with a history of urethral stricture and of orchitis who presents today for follow up.  He presented to the ED with the complaint of gross hematuria and groin swelling.  UA was positive for 0-5 RBC's and 21-50 WBC's.  Urine culture is negative.  WBC count was 16.4.  Serum creatinine 1.35.  He was admitted for IV antibiotics and observation.  He continued to improve clinically and was discharged on 08/10/2018.   He has had a DVIU about 20 years ago in [place].    He has one more tablet of Levaquin to take.  He has noted an 80% improvement in pain and swelling.   His older brother was diagnosed with prostate cancer at age 78.  It was treated with radiation.     Today, he states that he is 100% better.  He has completed his antibiotic.  Patient denies any gross hematuria, dysuria or suprapubic/flank pain.  Patient denies any fevers, chills, nausea or vomiting.     His UA is bland.    PMH: Past Medical History:  Diagnosis Date  . Hypertension     Surgical History: No past surgical history on file.  Home Medications:  Allergies as of 09/14/2018   No Known Allergies     Medication List        Accurate as of 09/14/18 10:51 AM. Always use your most recent med list.          Co Q10 200 MG Caps Take by mouth.   losartan-hydrochlorothiazide 100-25 MG tablet Commonly known as:  HYZAAR Take 1 tablet by mouth daily.   rosuvastatin 5 MG tablet Commonly known as:  CRESTOR Take 5 mg by mouth daily.       Allergies: No Known Allergies  Family History: No family history on file.  Social History:  reports that he  has never smoked. He has never used smokeless tobacco. He reports that he drinks alcohol. His drug history is not on file.  ROS: UROLOGY Frequent Urination?: No Hard to postpone urination?: No Burning/pain with urination?: No Get up at night to urinate?: No Urine stream starts and stops?: No Trouble starting stream?: No Do you have to strain to urinate?: No Blood in urine?: No Urinary tract infection?: No Sexually transmitted disease?: No Injury to kidneys or bladder?: No Painful intercourse?: No Weak stream?: No Erection problems?: No Penile pain?: No  Gastrointestinal Nausea?: No Indigestion/heartburn?: No Diarrhea?: No Constipation?: No  Constitutional Fever: No Night sweats?: No Weight loss?: No Fatigue?: No  Skin Skin rash/lesions?: No Itching?: No  Eyes Blurred vision?: No Double vision?: No  Ears/Nose/Throat Sore throat?: No Sinus problems?: No  Hematologic/Lymphatic Swollen glands?: No Easy bruising?: No  Cardiovascular Leg swelling?: No Chest pain?: No  Respiratory Shortness of breath?: No  Endocrine Excessive thirst?: No  Musculoskeletal Back pain?: No Joint pain?: No  Neurological Headaches?: No Dizziness?: No  Psychologic Depression?: No Anxiety?: No  Physical Exam: BP 134/89 (BP Location: Left Arm, Patient Position: Sitting, Cuff Size: Normal)   Pulse 89   Ht 5\' 9"  (1.753 m)   Wt 210 lb 12.8 oz (95.6  kg)   BMI 31.13 kg/m   Constitutional: Well nourished. Alert and oriented, No acute distress. HEENT: [place] AT, moist mucus membranes. Trachea midline, no masses. Cardiovascular: No clubbing, cyanosis, or edema. Respiratory: Normal respiratory effort, no increased work of breathing. GI: Abdomen is soft, non tender, non distended, no abdominal masses. Liver and spleen not palpable.  No hernias appreciated.  Stool sample for occult testing is not indicated.   GU: No CVA tenderness.  No bladder fullness or masses.  Patient with  circumcised phallus. Urethral meatus is patent.  No penile discharge. No penile lesions or rashes. Scrotum without lesions, cysts, rashes and/or edema.  Testicles are located scrotally bilaterally. No masses are appreciated in the testicles. Left and right epididymis are normal. Skin: No rashes, bruises or suspicious lesions. Lymph: No cervical or inguinal adenopathy. Neurologic: Grossly intact, no focal deficits, moving all 4 extremities. Psychiatric: Normal mood and affect.  Laboratory Data: Lab Results  Component Value Date   WBC 16.4 (H) 08/10/2018   HGB 12.9 (L) 08/10/2018   HCT 37.9 (L) 08/10/2018   MCV 87.4 08/10/2018   PLT 205 08/10/2018    Lab Results  Component Value Date   CREATININE 1.35 (H) 08/10/2018    No results found for: PSA  No results found for: TESTOSTERONE  Lab Results  Component Value Date   HGBA1C 6.1 (H) 08/08/2018    Lab Results  Component Value Date   TSH 0.952 08/08/2018    No results found for: CHOL, HDL, CHOLHDL, VLDL, LDLCALC  Lab Results  Component Value Date   AST 21 08/08/2018   Lab Results  Component Value Date   ALT 16 08/08/2018   No components found for: ALKALINEPHOPHATASE No components found for: BILIRUBINTOTAL  No results found for: ESTRADIOL  Urinalysis Bland.  See Epic.   I have reviewed the labs.   Pertinent Imaging: CLINICAL DATA:  RIGHT testicular swelling and pain for 1 day, gross hematuria yesterday  EXAM: SCROTAL ULTRASOUND  DOPPLER ULTRASOUND OF THE TESTICLES  TECHNIQUE: Complete ultrasound examination of the testicles, epididymis, and other scrotal structures was performed. Color and spectral Doppler ultrasound were also utilized to evaluate blood flow to the testicles.  COMPARISON:  None  FINDINGS: Right testicle  Measurements: 4.8 x 3.5 x 3.4 cm. Minimally heterogeneous. No focal mass lesion or calcification. Internal blood flow present on color Doppler imaging, question mildly  hypervascular.  Left testicle  Measurements: 4.4 x 2.3 x 3.1 cm. Normal morphology without mass. Two tiny calcifications noted. Internal blood flow present on color Doppler imaging question mildly hypervascular  Right epididymis:  Normal in size and appearance.  Left epididymis:  Normal in size and appearance.  Hydrocele: Small simple LEFT hydrocele. Large complicated RIGHT hydrocele containing multiple septations and scattered internal echogenicity/debris, could represent pyocele or hematocele. Probable 4 mm calcification within X RIGHT hydrocele inferiorly.  Varicocele:  LEFT varicocele present. No RIGHT varicocele.  Pulsed Doppler interrogation of both testes demonstrates normal low resistance arterial and venous waveforms bilaterally.  IMPRESSION: No evidence of testicular mass or torsion.  Mildly hypervascular appearing testes bilaterally which could reflect a orchitis.  Complicated RIGHT hydrocele collection with multiple septations and debris raising question of a pyocele or hematocele.  LEFT varicocele present.   Electronically Signed   By: Ulyses Southward M.D.   On: 08/08/2018 23:48  I have independently reviewed the films.    Assessment & Plan:    1. Orchitis Resolved  2. Elevated creatinine Rechecked today  3. Family  history of prostate cancer Patient's older brother has been diagnosed with prostate cancer and patient has no recent PSA on file PSA drawn today   4. Gross hematuria Resolved UA bland today  Return for FOLLOW UP PENDING LABS.  These notes generated with voice recognition software. I apologize for typographical errors.  Michiel Cowboy, PA-C  [facility] [address]  [address] [place], [place] [address] [phone]   PSA and creatinine are elevated.  He is coming in on 11/15 for recheck. (PVR, PSA, creatinine and scrotal ultrasound)

## 2018-09-15 ENCOUNTER — Telehealth: Payer: Self-pay | Admitting: Urology

## 2018-09-15 LAB — URINALYSIS, COMPLETE
Bilirubin, UA: NEGATIVE
GLUCOSE, UA: NEGATIVE
KETONES UA: NEGATIVE
Leukocytes, UA: NEGATIVE
NITRITE UA: NEGATIVE
PROTEIN UA: NEGATIVE
SPEC GRAV UA: 1.01 (ref 1.005–1.030)
UUROB: 0.2 mg/dL (ref 0.2–1.0)
pH, UA: 7 (ref 5.0–7.5)

## 2018-09-15 LAB — BUN+CREAT
BUN / CREAT RATIO: 14 (ref 10–24)
BUN: 18 mg/dL (ref 8–27)
Creatinine, Ser: 1.31 mg/dL — ABNORMAL HIGH (ref 0.76–1.27)
GFR calc Af Amer: 66 mL/min/{1.73_m2} (ref >59)
GFR calc non Af Amer: 57 mL/min/{1.73_m2} — ABNORMAL LOW (ref >59)

## 2018-09-15 LAB — MICROSCOPIC EXAMINATION
BACTERIA UA: NONE SEEN
RBC MICROSCOPIC, UA: NONE SEEN /HPF (ref 0–2)

## 2018-09-15 LAB — PSA: PROSTATE SPECIFIC AG, SERUM: 5.4 ng/mL — AB (ref 0.0–4.0)

## 2018-09-15 NOTE — Telephone Encounter (Signed)
LMOM for patient to return call.

## 2018-09-15 NOTE — Telephone Encounter (Signed)
Spoke to patient and advised him of lab results.  Scheduled a follow up appointment.

## 2018-09-15 NOTE — Telephone Encounter (Signed)
Please let Tom [name] know that his urine was clear of blood, but his PSA and creatinine are elevated.  I would like for him to come in for an office visit so that we can get a PVR and discuss the next steps.

## 2018-09-22 ENCOUNTER — Telehealth: Payer: Self-pay | Admitting: Urology

## 2018-09-22 NOTE — Telephone Encounter (Signed)
Spoke with the patient and answered his questions.  He will see me on 11/15.

## 2018-09-22 NOTE — Telephone Encounter (Signed)
Pt called asking to speak to [facility], states he would like to discuss his results and what he thinks about them etc. Explained to pt that Carollee Herter is in clinic seeing patients and he has a f/u appt to discuss issues, but will let shannon know he would like to speak to her about his questions, he stated it was no emergency and would wait until appt if needed. I reassured pt that I would still let Carollee Herter know he wants to talk to her, and that she may want to discuss this in detail at his upcoming appt. Please advise if needed. Thanks.

## 2018-10-13 ENCOUNTER — Ambulatory Visit: Payer: Medicare HMO | Admitting: Urology

## 2018-10-13 ENCOUNTER — Encounter: Payer: Self-pay | Admitting: Urology

## 2018-10-13 VITALS — BP 134/91 | HR 84 | Wt 208.0 lb

## 2018-10-13 DIAGNOSIS — R972 Elevated prostate specific antigen [PSA]: Secondary | ICD-10-CM

## 2018-10-13 DIAGNOSIS — R7989 Other specified abnormal findings of blood chemistry: Secondary | ICD-10-CM | POA: Diagnosis not present

## 2018-10-13 DIAGNOSIS — N452 Orchitis: Secondary | ICD-10-CM

## 2018-10-13 DIAGNOSIS — Z87448 Personal history of other diseases of urinary system: Secondary | ICD-10-CM | POA: Diagnosis not present

## 2018-10-13 LAB — BLADDER SCAN AMB NON-IMAGING: SCAN RESULT: 76

## 2018-10-13 NOTE — Progress Notes (Signed)
10/13/2018 10:52 AM   Tom [name] 02/12/1951 [id number]  Referring provider: Marina GoodellFeldpausch, Dale E, MD [address] [place], [place] [id number]  Chief Complaint  Patient presents with  . Results    HPI: Patient is 67 year old Caucasian male with a history of urethral stricture, history of orchitis, elevated creatinine, elevated PSA and a history of hematuria who presents today for follow-up.  History of urethral stricture He has had a DVIU about 20 years ago in [place].  No urinary complaints at this time.  Patient denies any gross hematuria, dysuria or suprapubic/flank pain.  Patient denies any fevers, chills, nausea or vomiting.   History of orchitis Scrotal US on 08/08/2018 revealed no evidence of testicular mass or torsion.  Mildly hypervascular appearing testes bilaterally which could reflect a orchitis.  Complicated RIGHT hydrocele collection with multiple septations and debris raising question of a pyocele or hematocele.  LEFT varicocele present.  He was admitted overnight for IV antibiotics and observation.  He was then discharged with oral antibiotics.  At his visit on 09/14/2018, he stated his symptoms had completely resolved.  Exam was benign.    Elevated creatinine Patient's creatinine was found to be 1.48 ED on August 08, 2018.  Repeat creatinine was 1.35 at discharge.  Repeat creatinine was 1.31.  PVR today is 76 mL.    Elevated PSA Recent PSA was found to be 5.4.  His oldest brother was diagnosed with prostate cancer at age 65 and was treated with radiation.  No previous PSAs on file.    History of hematuria Resolved with treatment of orchitis.  Follow-up UA was negative for hematuria.  PMH: Past Medical History:  Diagnosis Date  . Hypertension     Surgical History: No past surgical history on file.  Home Medications:  Allergies as of 10/13/2018   No Known Allergies     Medication List        Accurate as of 10/13/18 10:52 AM. Always use your  most recent med list.          Co Q10 200 MG Caps Take by mouth.   losartan-hydrochlorothiazide 100-25 MG tablet Commonly known as:  HYZAAR Take 1 tablet by mouth daily.   rosuvastatin 5 MG tablet Commonly known as:  CRESTOR Take 5 mg by mouth daily.       Allergies: No Known Allergies  Family History: No family history on file.  Social History:  reports that he has never smoked. He has never used smokeless tobacco. He reports that he drinks alcohol. His drug history is not on file.  ROS: UROLOGY Frequent Urination?: No Hard to postpone urination?: No Burning/pain with urination?: No Get up at night to urinate?: No Leakage of urine?: No Urine stream starts and stops?: No Trouble starting stream?: No Do you have to strain to urinate?: No Blood in urine?: No Urinary tract infection?: No Sexually transmitted disease?: No Injury to kidneys or bladder?: No Painful intercourse?: No Weak stream?: No Erection problems?: No Penile pain?: No  Gastrointestinal Nausea?: No Vomiting?: No Indigestion/heartburn?: No Diarrhea?: No Constipation?: No  Constitutional Fever: No Night sweats?: No Weight loss?: No Fatigue?: No  Skin Skin rash/lesions?: No Itching?: No  Eyes Blurred vision?: No Double vision?: No  Ears/Nose/Throat Sore throat?: No Sinus problems?: No  Hematologic/Lymphatic Swollen glands?: No Easy bruising?: No  Cardiovascular Leg swelling?: No Chest pain?: No  Respiratory Cough?: No Shortness of breath?: No  Endocrine Excessive thirst?: No  Musculoskeletal Back pain?: No Joint pain?: No  Neurological Headaches?: No Dizziness?: No  Psychologic Depression?: No Anxiety?: No  Physical Exam: BP (!) 134/91   Pulse 84   Wt 208 lb (94.3 kg)   BMI 30.72 kg/m   Constitutional: Well nourished. Alert and oriented, No acute distress. HEENT: [place] AT, moist mucus membranes. Trachea midline, no masses. Cardiovascular: No clubbing,  cyanosis, or edema. Respiratory: Normal respiratory effort, no increased work of breathing. Skin: No rashes, bruises or suspicious lesions. Lymph: No cervical or inguinal adenopathy. Neurologic: Grossly intact, no focal deficits, moving all 4 extremities. Psychiatric: Normal mood and affect.  Laboratory Data: Lab Results  Component Value Date   WBC 16.4 (H) 08/10/2018   HGB 12.9 (L) 08/10/2018   HCT 37.9 (L) 08/10/2018   MCV 87.4 08/10/2018   PLT 205 08/10/2018    Lab Results  Component Value Date   CREATININE 1.31 (H) 09/14/2018    No results found for: PSA  No results found for: TESTOSTERONE  Lab Results  Component Value Date   HGBA1C 6.1 (H) 08/08/2018    Lab Results  Component Value Date   TSH 0.952 08/08/2018    No results found for: CHOL, HDL, CHOLHDL, VLDL, LDLCALC  Lab Results  Component Value Date   AST 21 08/08/2018   Lab Results  Component Value Date   ALT 16 08/08/2018   No components found for: ALKALINEPHOPHATASE No components found for: BILIRUBINTOTAL  No results found for: ESTRADIOL  Results for orders placed or performed in visit on 10/13/18  Bladder Scan (Post Void Residual) in office  Result Value Ref Range   Scan Result 76    I have reviewed the labs.   Pertinent Imaging: CLINICAL DATA:  RIGHT testicular swelling and pain for 1 day, gross hematuria yesterday  EXAM: SCROTAL ULTRASOUND  DOPPLER ULTRASOUND OF THE TESTICLES  TECHNIQUE: Complete ultrasound examination of the testicles, epididymis, and other scrotal structures was performed. Color and spectral Doppler ultrasound were also utilized to evaluate blood flow to the testicles.  COMPARISON:  None  FINDINGS: Right testicle  Measurements: 4.8 x 3.5 x 3.4 cm. Minimally heterogeneous. No focal mass lesion or calcification. Internal blood flow present on color Doppler imaging, question mildly hypervascular.  Left testicle  Measurements: 4.4 x 2.3 x 3.1 cm.  Normal morphology without mass. Two tiny calcifications noted. Internal blood flow present on color Doppler imaging question mildly hypervascular  Right epididymis:  Normal in size and appearance.  Left epididymis:  Normal in size and appearance.  Hydrocele: Small simple LEFT hydrocele. Large complicated RIGHT hydrocele containing multiple septations and scattered internal echogenicity/debris, could represent pyocele or hematocele. Probable 4 mm calcification within X RIGHT hydrocele inferiorly.  Varicocele:  LEFT varicocele present. No RIGHT varicocele.  Pulsed Doppler interrogation of both testes demonstrates normal low resistance arterial and venous waveforms bilaterally.  IMPRESSION: No evidence of testicular mass or torsion.  Mildly hypervascular appearing testes bilaterally which could reflect a orchitis.  Complicated RIGHT hydrocele collection with multiple septations and debris raising question of a pyocele or hematocele.  LEFT varicocele present.   Electronically Signed   By: Ulyses Southward M.D.   On: 08/08/2018 23:48  Assessment & Plan:    1. Orchitis Resolved Repeat scrotal ultrasound ordered  2. Elevated creatinine Repeated today - if remains elevated will obtain RUS  PVR is 76 mL  3. Elevated PSA Repeated today as elevation may still be the sequela of the orchitis - if still elevated discussed possible next steps of pursuing a biopsy, MRI or  continuing to trend the PSA - the risks and benefits of each were discussed and patient would like to pursue a MRI if PSA is still elevated Patient's older brother has been diagnosed with prostate cancer and patient has no recent PSA on file  4. Gross hematuria Resolved  5.  History of urethral stricture No urinary complaints and PVR is minimal  Return for pending labs .  These notes generated with voice recognition software. I apologize for typographical errors.  Michiel Cowboy, PA-C  [facility] [address]  [address] [place], [place] [address] [phone]

## 2018-10-14 LAB — PSA: PROSTATE SPECIFIC AG, SERUM: 4.3 ng/mL — AB (ref 0.0–4.0)

## 2018-10-14 LAB — CREATININE, SERUM
Creatinine, Ser: 1.25 mg/dL (ref 0.76–1.27)
GFR calc Af Amer: 69 mL/min/{1.73_m2} (ref >59)
GFR calc non Af Amer: 60 mL/min/{1.73_m2} (ref >59)

## 2018-10-16 ENCOUNTER — Telehealth: Payer: Self-pay

## 2018-10-16 DIAGNOSIS — R972 Elevated prostate specific antigen [PSA]: Secondary | ICD-10-CM

## 2018-10-16 NOTE — Telephone Encounter (Signed)
-----   Message from Harle BattiestShannon A McGowan, PA-C sent at 10/16/2018 10:44 AM EST ----- Please let [name] know that his creatinine has decreased and is now in the normal levels.  His PSA has also continued to trend down.  I would like the PSA repeated again in one month to ensure the trend continues in a downward fashion.

## 2018-10-16 NOTE — Telephone Encounter (Signed)
Patient notified lab draw apt made in 1 month , orders entered

## 2018-10-20 ENCOUNTER — Ambulatory Visit
Admission: RE | Admit: 2018-10-20 | Discharge: 2018-10-20 | Disposition: A | Discharge status: Home / Self Care | Payer: Medicare HMO | Source: Ambulatory Visit | Attending: Urology | Admitting: Urology

## 2018-10-20 DIAGNOSIS — E119 Type 2 diabetes mellitus without complications: Secondary | ICD-10-CM | POA: Insufficient documentation

## 2018-10-20 DIAGNOSIS — E782 Mixed hyperlipidemia: Secondary | ICD-10-CM | POA: Diagnosis not present

## 2018-10-20 DIAGNOSIS — G4733 Obstructive sleep apnea (adult) (pediatric): Secondary | ICD-10-CM | POA: Insufficient documentation

## 2018-10-20 DIAGNOSIS — N452 Orchitis: Secondary | ICD-10-CM | POA: Diagnosis not present

## 2018-10-20 DIAGNOSIS — I861 Scrotal varices: Secondary | ICD-10-CM | POA: Insufficient documentation

## 2018-10-20 DIAGNOSIS — I10 Essential (primary) hypertension: Secondary | ICD-10-CM | POA: Diagnosis not present

## 2018-10-20 DIAGNOSIS — N433 Hydrocele, unspecified: Secondary | ICD-10-CM | POA: Diagnosis not present

## 2018-10-23 ENCOUNTER — Telehealth: Payer: Self-pay

## 2018-10-23 DIAGNOSIS — E119 Type 2 diabetes mellitus without complications: Secondary | ICD-10-CM | POA: Diagnosis not present

## 2018-10-23 DIAGNOSIS — E782 Mixed hyperlipidemia: Secondary | ICD-10-CM | POA: Diagnosis not present

## 2018-10-23 NOTE — Telephone Encounter (Signed)
-----   Message from Harle BattiestShannon A McGowan, PA-C sent at 10/23/2018 10:05 AM EST ----- Please let Mr.Tashiro know that his scrotal ultrasound shows that his infection has resolved.

## 2018-10-23 NOTE — Telephone Encounter (Signed)
Informed patient of ultrasound findings.

## 2018-11-13 ENCOUNTER — Other Ambulatory Visit: Payer: Medicare HMO

## 2018-11-13 DIAGNOSIS — R972 Elevated prostate specific antigen [PSA]: Secondary | ICD-10-CM | POA: Diagnosis not present

## 2018-11-14 ENCOUNTER — Telehealth: Payer: Self-pay | Admitting: Urology

## 2018-11-14 LAB — PSA: Prostate Specific Ag, Serum: 3.6 ng/mL (ref 0.0–4.0)

## 2018-11-14 NOTE — Telephone Encounter (Signed)
Tom [name],  I spoke with pt about his lab results, pt wanted to assure he has all his appts scheduled, did you want him to just come back in 3 months for [name] lab only visit or schedule that and [name] follow up ov   McGowan, Tom HampshireShannon A, PA-C  [facility]        Please let [name] know that his PSA continues to trend down.  As we do not have previous PSA's on file, I would like to check his PSA again in 3 months to establish [name] baseline as it is now in the normal range.

## 2018-11-14 NOTE — Telephone Encounter (Signed)
Lab only 

## 2018-11-16 ENCOUNTER — Telehealth: Payer: Self-pay | Admitting: Urology

## 2018-11-16 NOTE — Telephone Encounter (Signed)
Pt lmom stating that [facility] he would like his results and also has some questions/concerns he'd like to discuss as well. Please advise. Thank you.

## 2018-12-05 ENCOUNTER — Telehealth: Payer: Self-pay | Admitting: Urology

## 2018-12-05 ENCOUNTER — Other Ambulatory Visit: Payer: Self-pay | Admitting: Urology

## 2018-12-05 NOTE — Telephone Encounter (Signed)
Patient had questions regarding ED.  I explained to the patient that in order to achieve an erection it takes good functioning of the nervous system (parasympathetic and rs, sympathetic, sensory and motor), good blood flow into the erectile tissue of the penis and a desire to have sex.  I explained that conditions like diabetes, hypertension, coronary artery disease, peripheral vascular disease, smoking, alcohol consumption, age, sleep apnea and BPH can diminish the ability to have an erection.  A recent study published in Sex Med 2018 Apr 13 revealed moderate to vigorous aerobic exercise for 40 minutes 4 times per week can decrease erectile problems caused by physical inactivity, obesity, hypertension, metabolic syndrome and/or cardiovascular diseases.  He wanted to know if I recommended anything organic and I stated that I was not aware of any OTC supplements that would improve erections.  We talked about PDE5i and their side effects.  (headache, flushing, dyspepsia, abnormal vision, nasal congestion, back pain, myalgia, nausea, dizziness, and rash.)  He is not wanting to address his ED at this point with medications.  He will schedule his follow up appointment for 3 months for repeat PSA.

## 2018-12-05 NOTE — Telephone Encounter (Signed)
Mr. Johansson wanted to speak with me further regarding his test results.  I have left a message for him to return me call.

## 2019-02-04 IMAGING — US US SCROTUM W/ DOPPLER COMPLETE
1 series · 13 of 25 positions shown · non-contrast
Comparison: None

CLINICAL DATA: RIGHT testicular swelling and pain for 1 day, gross
hematuria yesterday

EXAM:
SCROTAL ULTRASOUND
DOPPLER ULTRASOUND OF THE TESTICLES
TECHNIQUE: Complete ultrasound examination of the testicles, epididymis, and
other scrotal structures was performed. Color and spectral Doppler
ultrasound were also utilized to evaluate blood flow to the
testicles.

[Series 1: us scrotum w/ doppler complete · 0.10mm/px · 13 of 78 slices shown]
[im 1/78]
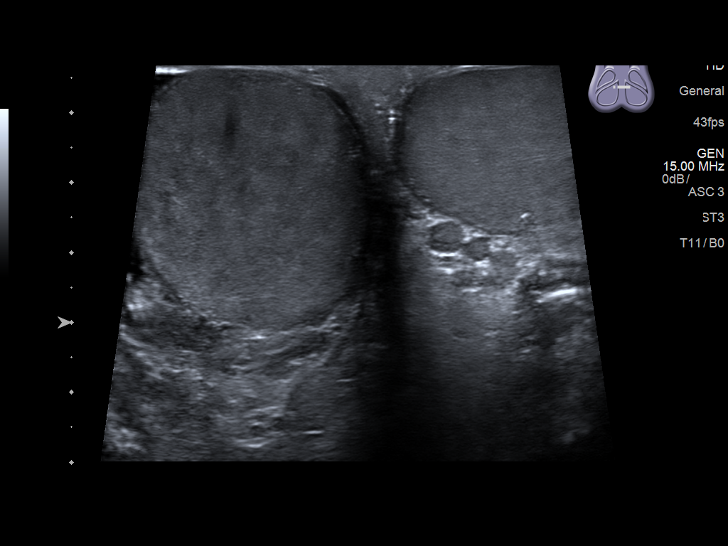
[im 7/78]
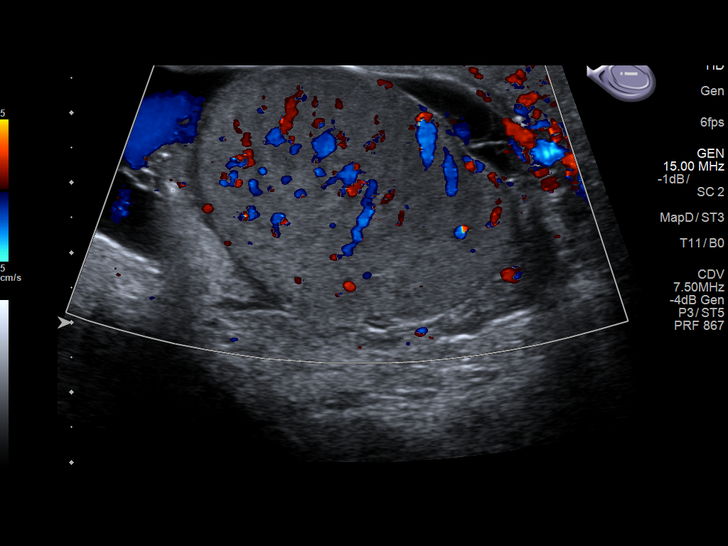
[im 13/78]
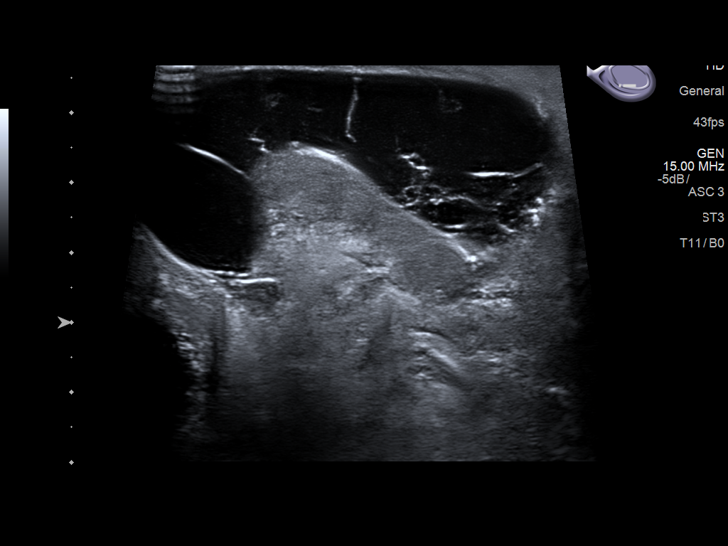
[im 20/78]
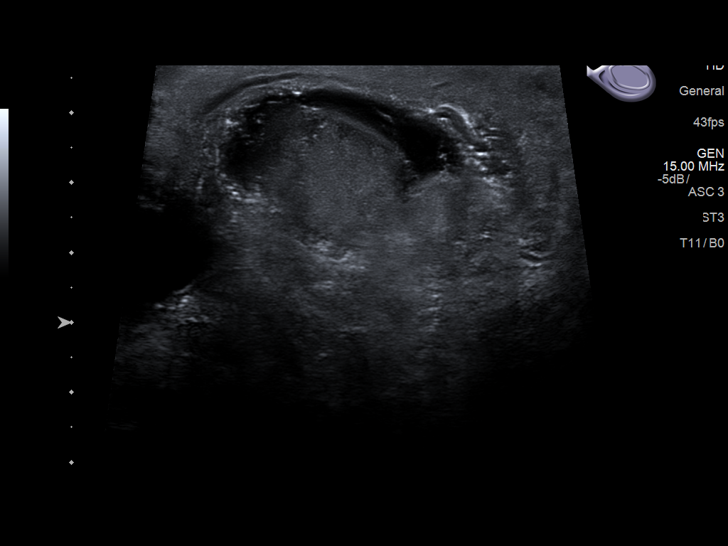
[im 26/78]
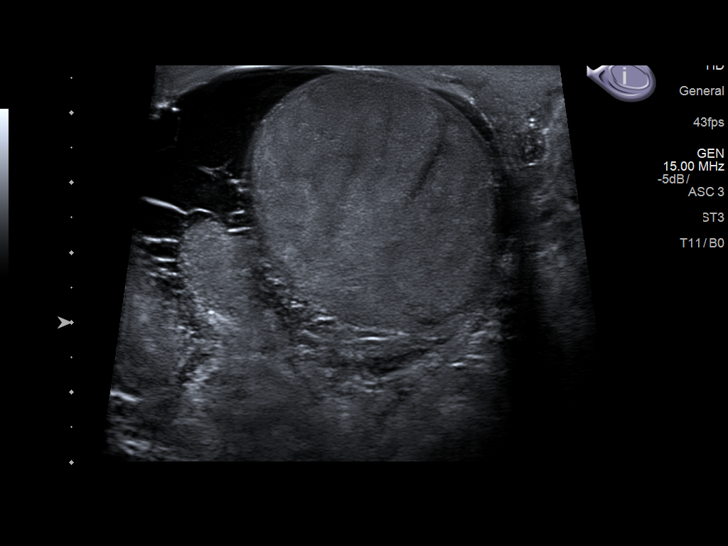
[im 33/78]
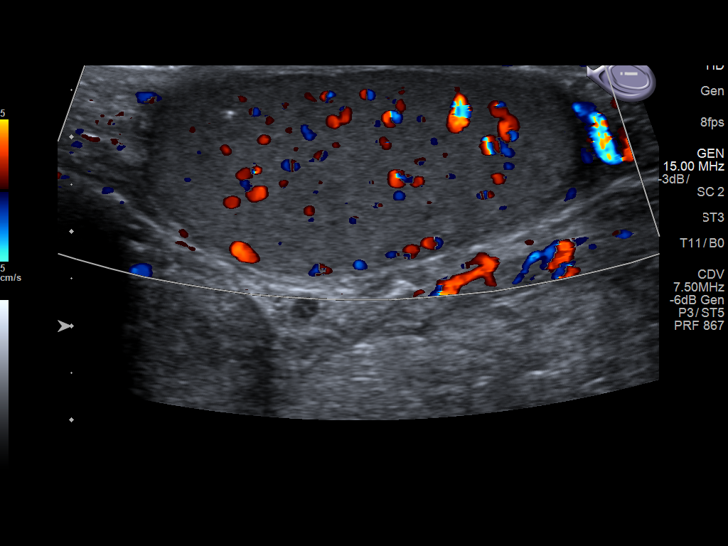
[im 39/78]
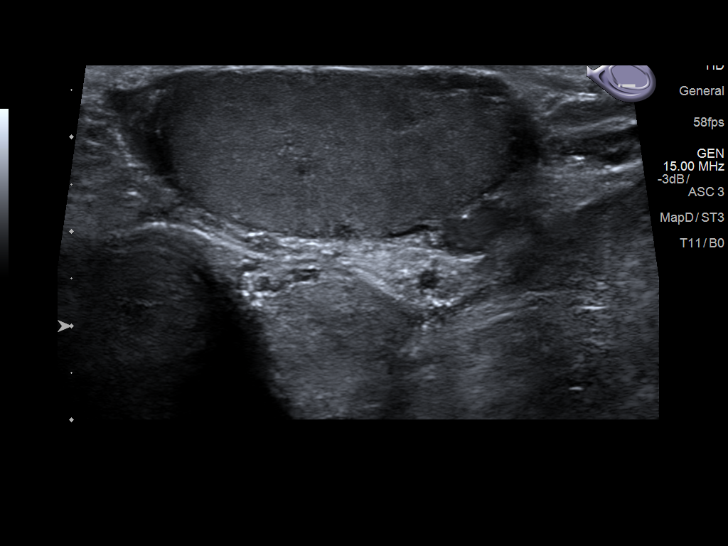
[im 45/78]
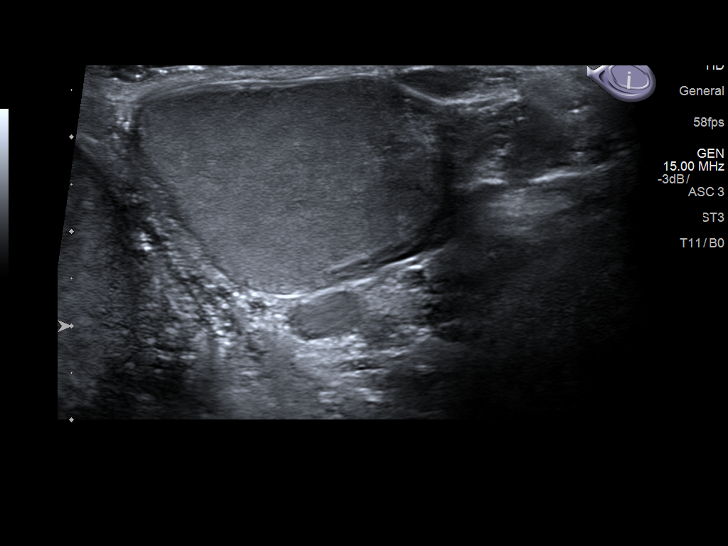
[im 52/78]
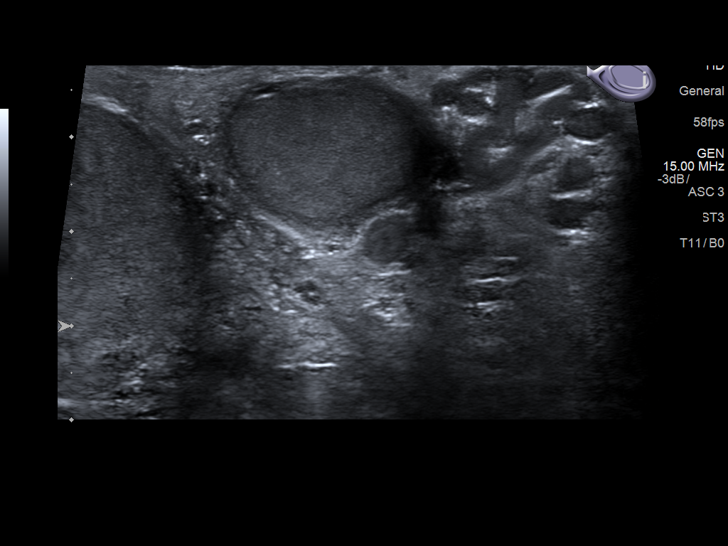
[im 58/78]
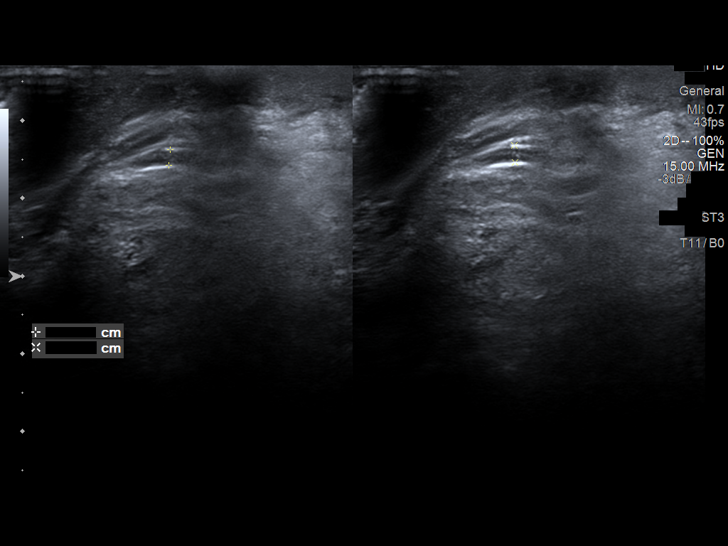
[im 65/78]
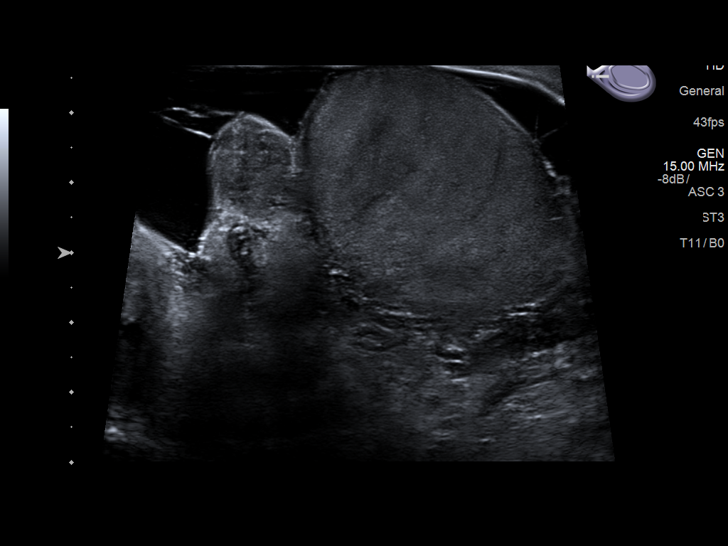
[im 71/78]
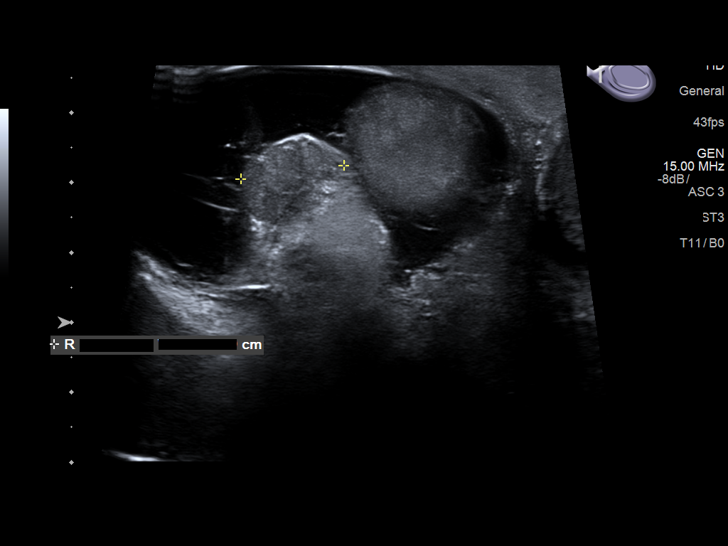
[im 78/78]
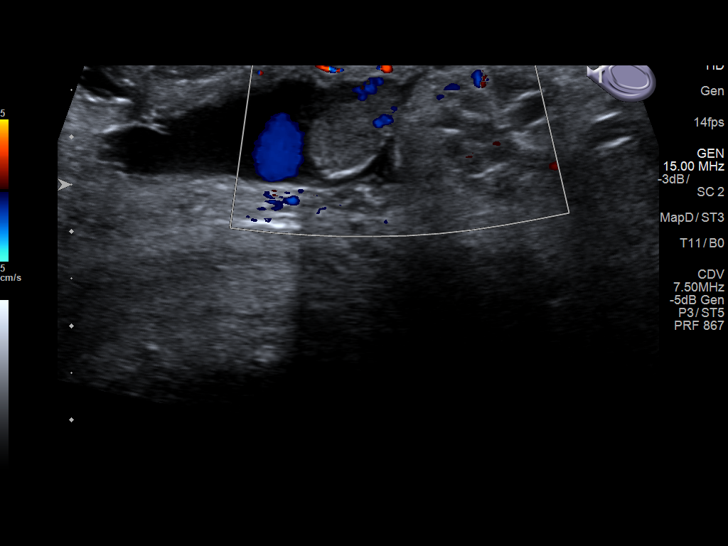

[13 of 25 positions shown; findings below may reference images not displayed]

FINDINGS: Right testicle

Measurements: 4.8 x 3.5 x 3.4 cm. Minimally heterogeneous. No focal
mass lesion or calcification. Internal blood flow present on color
Doppler imaging, question mildly hypervascular.

Left testicle

Measurements: 4.4 x 2.3 x 3.1 cm. Normal morphology without mass.
Two tiny calcifications noted. Internal blood flow present on color
Doppler imaging question mildly hypervascular

Right epididymis:  Normal in size and appearance.

Left epididymis:  Normal in size and appearance.

Hydrocele: Small simple LEFT hydrocele. Large complicated RIGHT
hydrocele containing multiple septations and scattered internal
echogenicity/debris, could represent pyocele or hematocele. Probable
4 mm calcification within X RIGHT hydrocele inferiorly.

Varicocele:  LEFT varicocele present. No RIGHT varicocele.

Pulsed Doppler interrogation of both testes demonstrates normal low
resistance arterial and venous waveforms bilaterally.
IMPRESSION: No evidence of testicular mass or torsion.

Mildly hypervascular appearing testes bilaterally which could
reflect a orchitis.

Complicated RIGHT hydrocele collection with multiple septations and
debris raising question of a pyocele or hematocele.

LEFT varicocele present.

## 2019-03-02 ENCOUNTER — Other Ambulatory Visit: Payer: Self-pay

## 2019-03-02 DIAGNOSIS — R972 Elevated prostate specific antigen [PSA]: Secondary | ICD-10-CM

## 2019-03-06 ENCOUNTER — Ambulatory Visit: Payer: Medicare HMO | Admitting: Urology

## 2019-03-06 ENCOUNTER — Other Ambulatory Visit: Payer: Medicare HMO

## 2019-03-12 ENCOUNTER — Other Ambulatory Visit: Payer: Self-pay

## 2019-03-12 DIAGNOSIS — R972 Elevated prostate specific antigen [PSA]: Secondary | ICD-10-CM

## 2019-03-13 ENCOUNTER — Telehealth: Payer: Self-pay

## 2019-03-13 LAB — PSA: Prostate Specific Ag, Serum: 3.7 ng/mL (ref 0.0–4.0)

## 2019-03-13 NOTE — Telephone Encounter (Signed)
-----   Message from Harle Battiest, PA-C sent at 03/13/2019  8:58 AM EDT ----- Please let Tom [name] know that his PSA has remained stable at 3.7.  I would like to see him in 3 months.

## 2019-03-13 NOTE — Telephone Encounter (Signed)
Called and advised patient. Appt set in July. Patient verbalized understanding and confirmed appt

## 2019-03-15 ENCOUNTER — Other Ambulatory Visit: Payer: Self-pay

## 2019-03-15 ENCOUNTER — Emergency Department
Admission: EM | Admit: 2019-03-15 | Discharge: 2019-03-15 | Disposition: A | Discharge status: Home / Self Care | Payer: Medicare HMO | Attending: Emergency Medicine | Admitting: Emergency Medicine

## 2019-03-15 DIAGNOSIS — Z79899 Other long term (current) drug therapy: Secondary | ICD-10-CM | POA: Insufficient documentation

## 2019-03-15 DIAGNOSIS — G510 Bell's palsy: Secondary | ICD-10-CM | POA: Diagnosis not present

## 2019-03-15 DIAGNOSIS — E119 Type 2 diabetes mellitus without complications: Secondary | ICD-10-CM | POA: Insufficient documentation

## 2019-03-15 DIAGNOSIS — R29810 Facial weakness: Secondary | ICD-10-CM | POA: Diagnosis present

## 2019-03-15 DIAGNOSIS — I10 Essential (primary) hypertension: Secondary | ICD-10-CM | POA: Insufficient documentation

## 2019-03-15 MED ORDER — VALACYCLOVIR HCL 500 MG PO TABS
1000.0000 mg | ORAL_TABLET | Freq: Once | ORAL | Status: AC
  Administered 2019-03-15: 1000 mg via ORAL
  Filled 2019-03-15: qty 2

## 2019-03-15 MED ORDER — PREDNISONE 20 MG PO TABS
60.0000 mg | ORAL_TABLET | Freq: Once | ORAL | Status: AC
  Administered 2019-03-15: 23:00:00 60 mg via ORAL
  Filled 2019-03-15: qty 3

## 2019-03-15 MED ORDER — PREDNISONE 10 MG (21) PO TBPK: ORAL_TABLET | ORAL | 0 refills | Status: DC

## 2019-03-15 MED ORDER — ERYTHROMYCIN 5 MG/GM OP OINT
TOPICAL_OINTMENT | Freq: Once | OPHTHALMIC | Status: AC
  Administered 2019-03-15: 1 via OPHTHALMIC
  Filled 2019-03-15: qty 1

## 2019-03-15 MED ORDER — VALACYCLOVIR HCL 1 G PO TABS: 1000.0000 mg | ORAL_TABLET | Freq: Three times a day (TID) | ORAL | 0 refills | Status: DC

## 2019-03-15 MED ORDER — ERYTHROMYCIN 5 MG/GM OP OINT: 1.0000 "application " | TOPICAL_OINTMENT | Freq: Every day | OPHTHALMIC | 0 refills | Status: AC

## 2019-03-15 NOTE — ED Notes (Signed)
Reviewed discharge instructions, follow-up care, and prescriptions with patient. Patient verbalized understanding of all information reviewed. Patient stable, with no distress noted at this time.    

## 2019-03-15 NOTE — ED Notes (Signed)
Patient c/o right-sided facial droop noticed upon awakening today. Patient reports decreased sensation to right face and tingling of right tongue.   No weakness or decreased sensation noted to rest of body. Patient AO X 4. No dysarthria noted. No gait or movement abnormalities seen. No visual deficits.

## 2019-03-15 NOTE — ED Provider Notes (Signed)
[facility] Emergency Department Provider Note  ____________________________________________   First MD Initiated Contact with Patient 03/15/19 2148     (approximate)  I have reviewed the triage vital signs and the nursing notes.   HISTORY  Chief Complaint Facial Droop   HPI Tom [name] is a 68 y.o. male who presents to the emergency department for treatment and evaluation of right-sided facial droop, odd sensation in the right side of his tongue, and inability to easily close the right.  He has not had the symptoms in the past.  He also denies any recent viral or URI symptoms.  He does state that he has been under pretty significant amount of stress since breaking up with his girlfriend 3 days ago.  He has had no headache, confusion, or weakness in the extremities.  He denies recent injury.  He denies change in vision or hearing.  No alleviating measures attempted prior to arrival.     Past Medical History:  Diagnosis Date  . Hypertension     Patient Active Problem List   Diagnosis Date Noted  . Diet-controlled diabetes mellitus (HCC) 10/20/2018  . Essential hypertension 10/20/2018  . Mixed hyperlipidemia 10/20/2018  . Obstructive sleep apnea 10/20/2018  . Orchitis 08/09/2018    No past surgical history on file.  Prior to Admission medications   Medication Sig Start Date End Date Taking? Authorizing Provider  Coenzyme Q10 (CO Q10) 200 MG CAPS Take by mouth.    [provider]  erythromycin ophthalmic ointment Place 1 application into the right eye at bedtime for 7 days. 03/15/19 03/22/19  Tahani Potier, Rulon Eisenmenger B, FNP  losartan-hydrochlorothiazide (HYZAAR) 100-25 MG tablet Take 1 tablet by mouth daily. 07/12/18   [provider]  predniSONE (STERAPRED UNI-PAK 21 TAB) 10 MG (21) TBPK tablet Take 6 tablets on the first day and decrease by 1 tablet each day until finished. 03/15/19   Leshae Mcclay B, FNP  rosuvastatin (CRESTOR) 5 MG  tablet Take 5 mg by mouth daily. 07/23/18   [provider]  valACYclovir (VALTREX) 1000 MG tablet Take 1 tablet (1,000 mg total) by mouth 3 (three) times daily. 03/15/19   Chinita Pester, FNP    Allergies Patient has no known allergies.  No family history on file.  Social History Social History   Tobacco Use  . Smoking status: Never Smoker  . Smokeless tobacco: Never Used  Substance Use Topics  . Alcohol use: Yes    Comment: socially only  . Drug use: Not on file    Review of Systems  Constitutional: No fever/chills Eyes: No visual changes. ENT: No sore throat.  Positive for tingling sensation on the right side of the tongue Cardiovascular: Denies chest pain. Respiratory: Denies shortness of breath. Gastrointestinal: No abdominal pain.  No nausea, no vomiting.  No diarrhea.  No constipation. Genitourinary: Negative for dysuria. Musculoskeletal: Negative for back pain. Skin: Negative for rash. Neurological: Negative for headaches, positive for focal weakness of the right eyelid, forehead, right side of the mouth. ____________________________________________   PHYSICAL EXAM:  VITAL SIGNS: ED Triage Vitals [03/15/19 2124]  Enc Vitals Group     BP (!) 162/101     Pulse Rate 87     Resp 20     Temp 98.2 F (36.8 C)     Temp Source Oral     SpO2 96 %     Weight 210 lb (95.3 kg)     Height      Head Circumference  Peak Flow      Pain Score 0     Pain Loc      Pain Edu?      Excl. in GC?    Constitutional: Alert and oriented. Well appearing and in no acute distress. Eyes: Conjunctivae are normal. PERRL. EOMI. Head: Atraumatic.  Right side forehead involved. Nose: No congestion/rhinnorhea. Mouth/Throat: Mucous membranes are moist.  Oropharynx non-erythematous. Neck: No stridor.   Cardiovascular: Normal rate, regular rhythm. Grossly normal heart sounds.  Good peripheral circulation. Respiratory: Normal respiratory effort.  No retractions. Lungs  CTAB. Gastrointestinal: Soft and nontender. Musculoskeletal: No lower extremity tenderness nor edema.  No joint effusions. Neurologic:  Normal speech and language. Right side forehead, right eyelid, and right facial droop. Tongue protrudes midline. Grip strength is equal. Ambulates unassisted with steady gait.  Skin:  Skin is warm, dry and intact. No rash noted. Psychiatric: Mood and affect are normal. Speech and behavior are normal.  ____________________________________________   LABS (all labs ordered are listed, but only abnormal results are displayed)  Labs Reviewed - No data to display ____________________________________________  EKG  Not indicated. ____________________________________________  RADIOLOGY  ED MD interpretation:  Not indicated.  Official radiology report(s): No results found.  ____________________________________________   PROCEDURES  Procedure(s) performed: None  Procedures  Critical Care performed: No  ____________________________________________   INITIAL IMPRESSION / ASSESSMENT AND PLAN / ED COURSE  68 year old male presents to the emergency department for treatment and evaluation of facial weakness that was noticed upon awakening this morning.  Symptoms and exam are most consistent with Bell's palsy.  He will be treated with prednisone and valacyclovir. First doses of his medications were provided tonight since the pharmacies are closed.  He will be given erythromycin ointment to be used in the eye at night.  He was advised to purchase some type of lubricating eyedrops to be used during the day.  He was encouraged to see his primary care provider within a week even if he feels better.  [facility] and is to call to schedule an appointment if he does not see any improvement over the next few days while taking his medications.  He was advised to return to the emergency department for any symptom that changes  or worsens if he is unable to schedule an appointment with either of the 2 above.   ____________________________________________   FINAL CLINICAL IMPRESSION(S) / ED DIAGNOSES  Final diagnoses:  Bell's palsy     ED Discharge Orders         Ordered    predniSONE (STERAPRED UNI-PAK 21 TAB) 10 MG (21) TBPK tablet     03/15/19 2236    valACYclovir (VALTREX) 1000 MG tablet  3 times daily     03/15/19 2236    erythromycin ophthalmic ointment  Daily at bedtime     03/15/19 2236           Note:  This document was prepared using Dragon voice recognition software and may include unintentional dictation errors.    Chinita Pesterriplett, Laquana Villari B, FNP 03/15/19 2307    Jeanmarie PlantMcShane, James A, MD 03/15/19 2324

## 2019-03-15 NOTE — ED Triage Notes (Signed)
Pt in with co right sided facial numbness, pt unable to close right eye. No weakness or numbness or extremeties.

## 2019-03-15 NOTE — Discharge Instructions (Signed)
Use GenTeal drops in your eye throughout the day if it feel irritated.  See primary care in about a week even if you think you are better.   See neurology within the week if you have had no improvement with the medication.

## 2019-04-18 IMAGING — US US SCROTUM W/ DOPPLER COMPLETE
1 series · 13 of 25 positions shown · non-contrast
Comparison: 08/08/2018

CLINICAL DATA: Hydrocele, orchitis, follow-up after antibiotic
therapy

EXAM:
SCROTAL ULTRASOUND
DOPPLER ULTRASOUND OF THE TESTICLES
TECHNIQUE: Complete ultrasound examination of the testicles, epididymis, and
other scrotal structures was performed. Color and spectral Doppler
ultrasound were also utilized to evaluate blood flow to the
testicles.

[Series 1: us scrotum w/ doppler complete · 0.07mm/px · 13 of 61 slices shown]
[im 1/61]
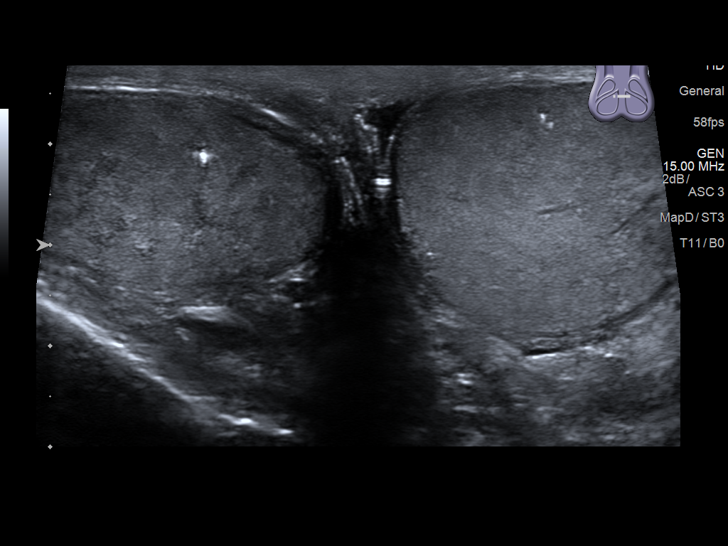
[im 6/61]
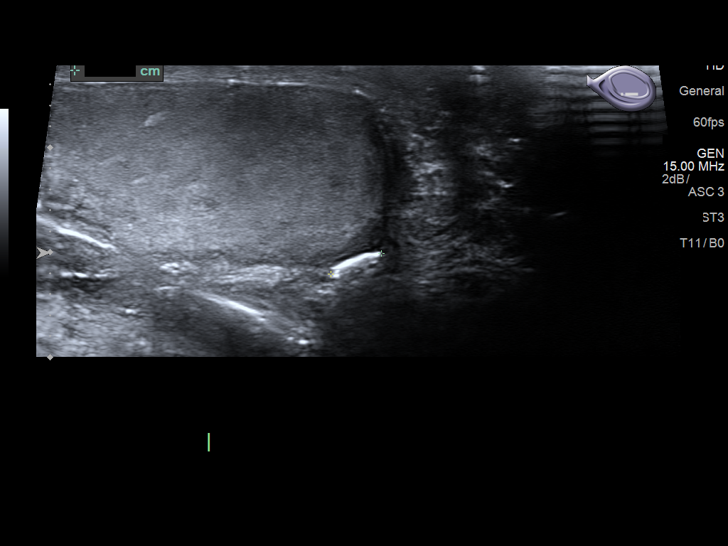
[im 11/61]
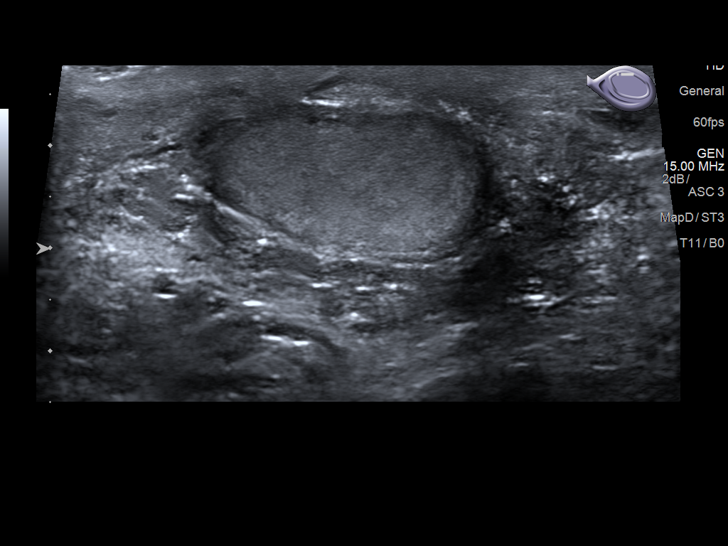
[im 16/61]
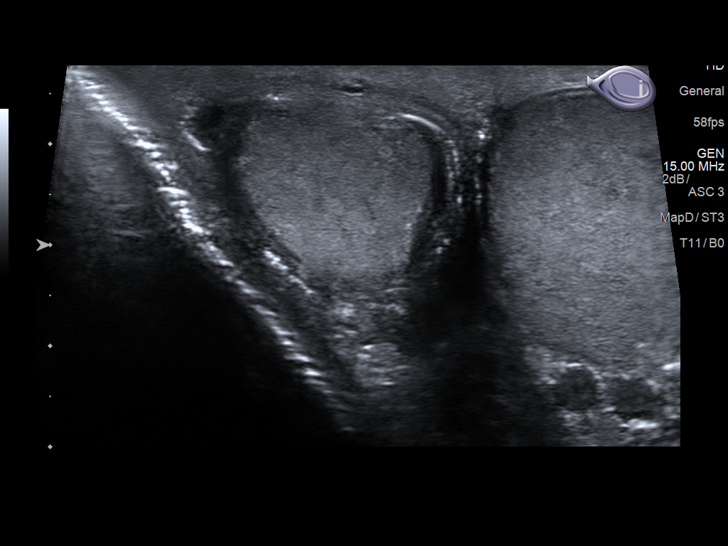
[im 21/61]
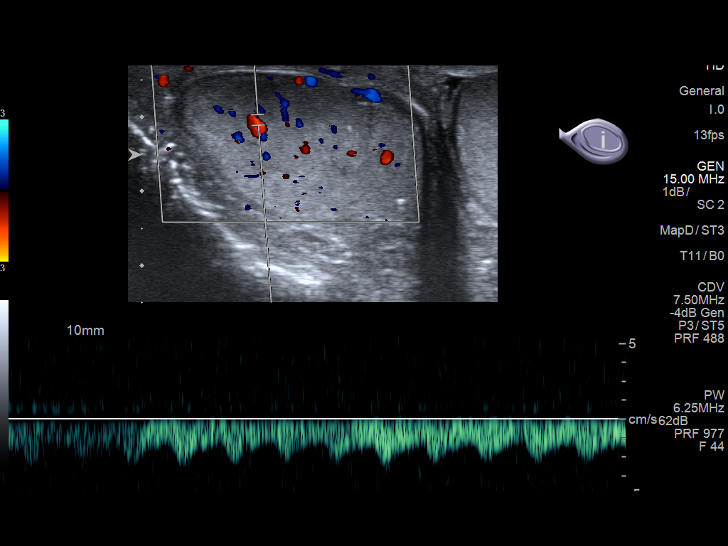
[im 26/61]
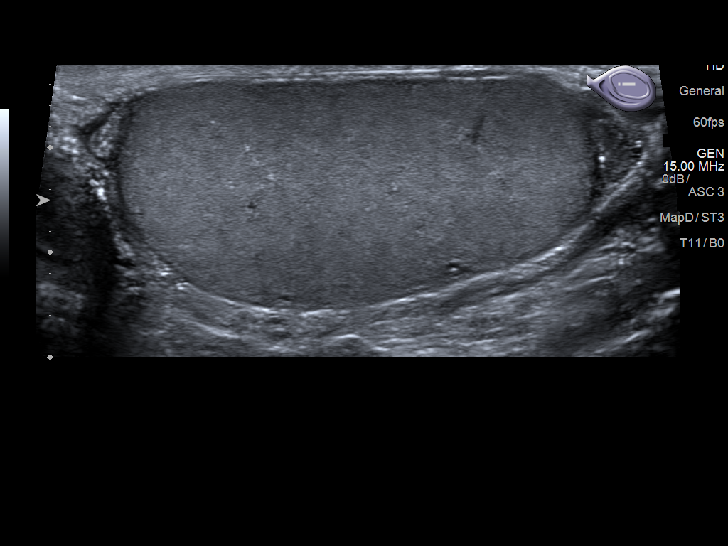
[im 31/61]
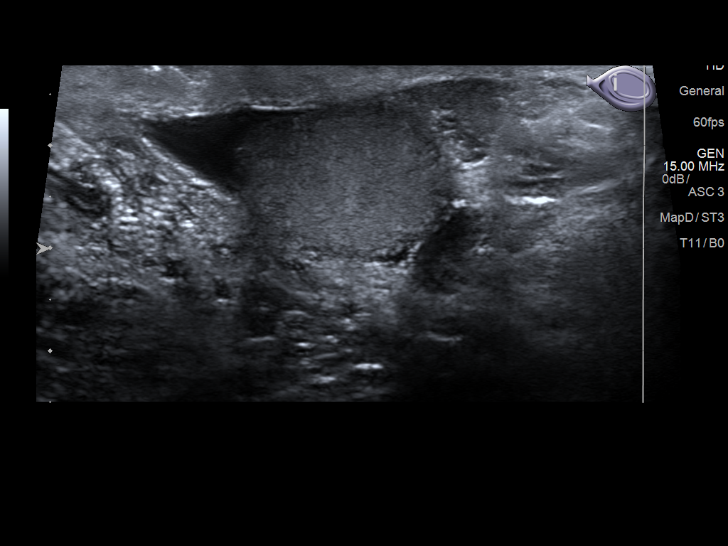
[im 36/61]
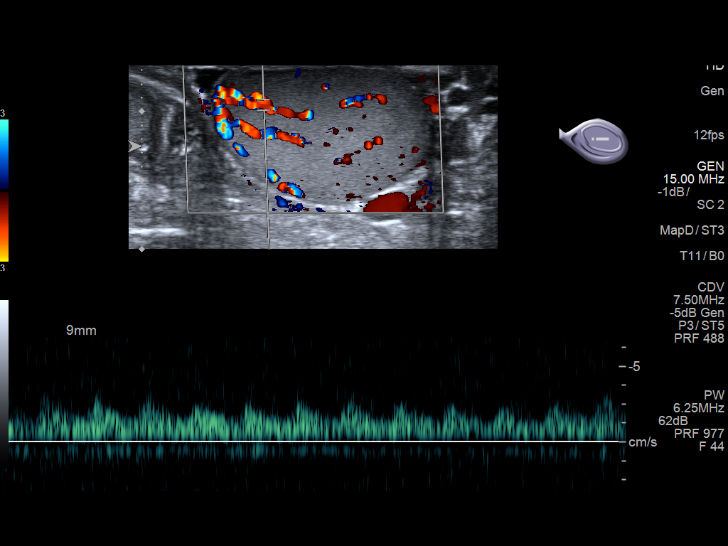
[im 41/61]
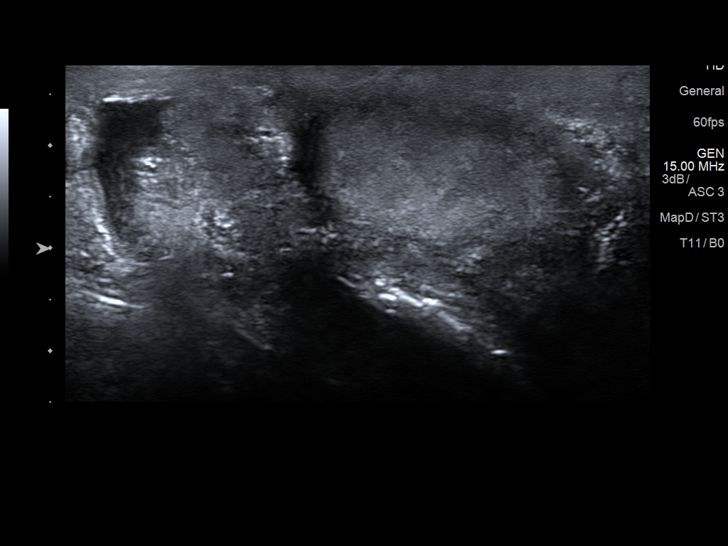
[im 46/61]
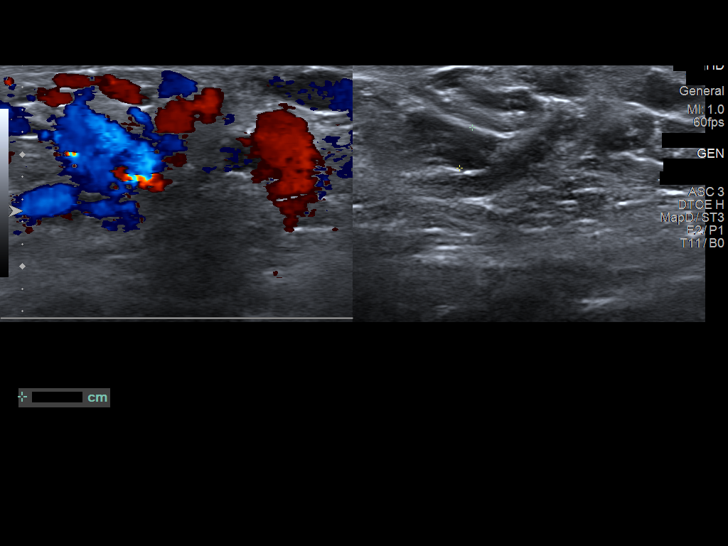
[im 51/61]
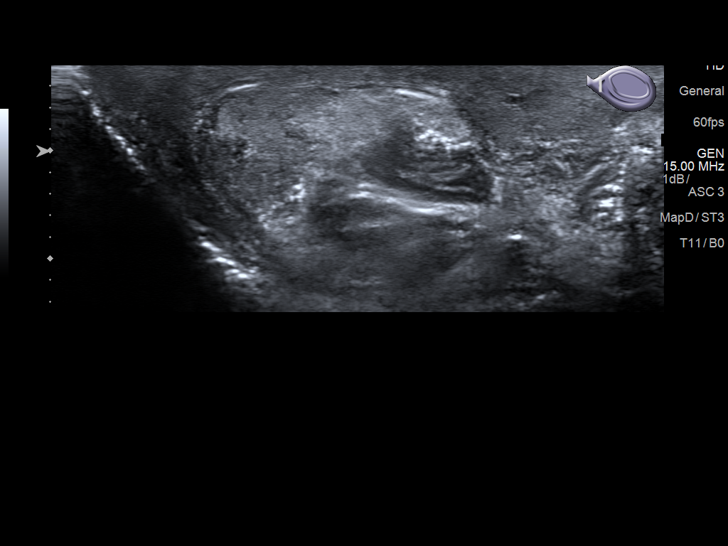
[im 56/61]
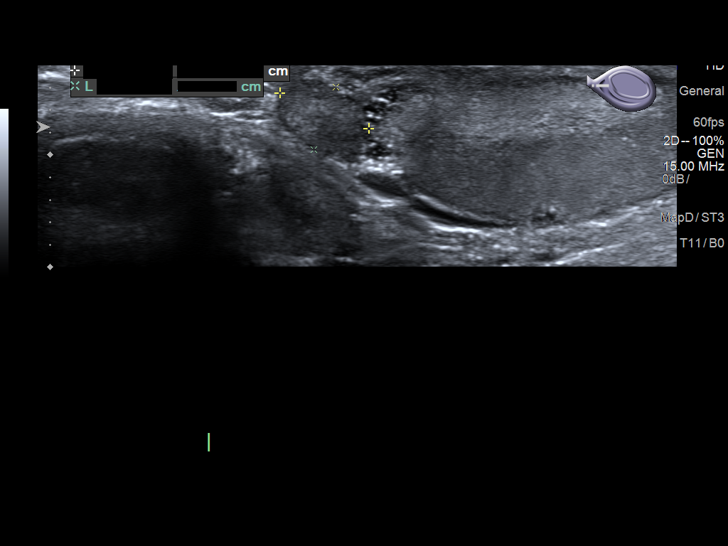
[im 61/61]
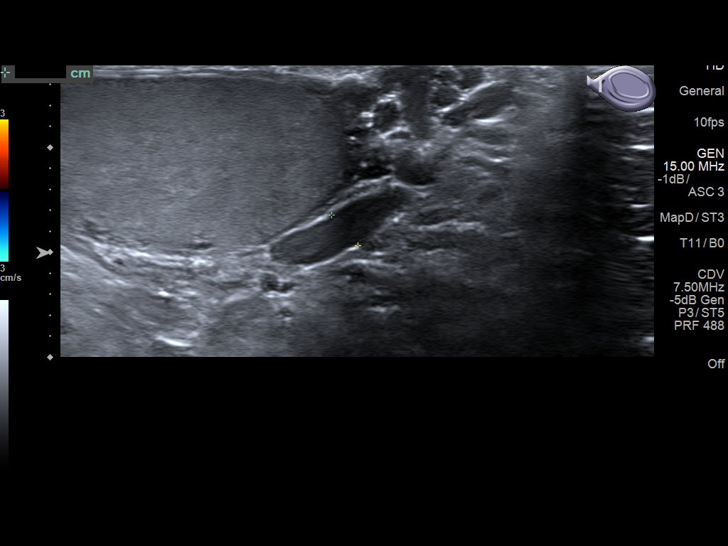

[13 of 25 positions shown; findings below may reference images not displayed]

FINDINGS: Right testicle

Measurements: 4.0 x 2.0 x 3.0 cm. Mildly heterogeneous echogenicity.
Small parenchymal calcification than RIGHT testis. No mass
identified. Internal blood flow present on color Doppler imaging,
symmetric with the LEFT. Small calcification adjacent to RIGHT
testis question scrotal pearl 5 mm diameter.

Left testicle

Measurements: 4.5 x 2.2 x 3.3 cm. Single microcalcification. Normal
echogenicity without mass. Internal blood flow present on color
Doppler imaging symmetric with RIGHT.

Right epididymis:  Normal in size and appearance.

Left epididymis:  Normal in size and appearance.

Hydrocele:  Minimal hydrocele bilaterally

Varicocele:  LEFT varicocele.  Equivocal RIGHT varicocele.

Pulsed Doppler interrogation of both testes demonstrates normal low
resistance arterial and venous waveforms bilaterally.
IMPRESSION: Resolution of the complicated RIGHT hydrocele collection seen on the
previous exam, which could represent a pyocele or hematocele.

Unremarkable testes.

LEFT varicocele.

## 2019-04-20 DIAGNOSIS — M66321 Spontaneous rupture of flexor tendons, right upper arm: Secondary | ICD-10-CM | POA: Diagnosis not present

## 2019-04-20 DIAGNOSIS — Z0000 Encounter for general adult medical examination without abnormal findings: Secondary | ICD-10-CM | POA: Diagnosis not present

## 2019-04-20 DIAGNOSIS — L309 Dermatitis, unspecified: Secondary | ICD-10-CM | POA: Diagnosis not present

## 2019-04-20 DIAGNOSIS — E782 Mixed hyperlipidemia: Secondary | ICD-10-CM | POA: Diagnosis not present

## 2019-04-20 DIAGNOSIS — E119 Type 2 diabetes mellitus without complications: Secondary | ICD-10-CM | POA: Diagnosis not present

## 2019-04-20 DIAGNOSIS — I10 Essential (primary) hypertension: Secondary | ICD-10-CM | POA: Diagnosis not present

## 2019-04-20 DIAGNOSIS — G510 Bell's palsy: Secondary | ICD-10-CM | POA: Diagnosis not present

## 2019-04-20 DIAGNOSIS — G4733 Obstructive sleep apnea (adult) (pediatric): Secondary | ICD-10-CM | POA: Diagnosis not present

## 2019-06-24 NOTE — Progress Notes (Signed)
06/25/2019 10:26 AM   Si Raiderhristopher A Shuster 04/27/1951 [id number]  Referring provider: Marina GoodellFeldpausch, Dale E, MD [address] [place],  [place] [id number]  Chief Complaint  Patient presents with  . Follow-up    HPI: Patient is 68 year old Caucasian male with a history of urethral stricture, history of orchitis, elevated creatinine, elevated PSA and a history of hematuria who presents today for follow-up.  History of urethral stricture He has had a DVIU about 20 years ago in [place].    History of orchitis Scrotal US on 08/08/2018 revealed no evidence of testicular mass or torsion.  Mildly hypervascular appearing testes bilaterally which could reflect a orchitis.  Complicated RIGHT hydrocele collection with multiple septations and debris raising question of a pyocele or hematocele.  LEFT varicocele present.  He was admitted overnight for IV antibiotics and observation.  He was then discharged with oral antibiotics.  At his visit on 09/14/2018, he stated his symptoms had completely resolved.  Exam was benign.    Elevated creatinine Patient's creatinine was found to be 1.48 ED on August 08, 2018.  Repeat creatinine was 1.35 at discharge.  Recent creatinine 1.4 in 03/2019.  PVR today is 32 mL.    Elevated PSA PSA Trend  5.4 in 08/2018  4.3 in 09/2018  3.6 in 10/2018  3.7 in 02/2019  2.92 in 05/2019  His oldest brother was diagnosed with prostate cancer at age 66 and was treated with radiation.    History of hematuria Resolved with treatment of orchitis.  Follow-up UA was negative for hematuria.   Erectile dysfunction Refused to fill out SHIM score sheet.  He has been having difficulty with erections for a few years.   His major complaint is the erections too soft.  His libido is preserived.   His risk factors for ED are age, BPH, DM, HTN, HLD and sleep apnea.  He denies any painful erections or curvatures with his erections.   He is no longer having spontaneous erections.     PMH: Past Medical History:  Diagnosis Date  . Hypertension     Surgical History: No past surgical history on file.  Home Medications:  Allergies as of 06/25/2019   No Known Allergies     Medication List       Accurate as of June 25, 2019 10:26 AM. If you have any questions, ask your nurse or doctor.        STOP taking these medications   predniSONE 10 MG (21) Tbpk tablet Commonly known as: STERAPRED UNI-PAK 21 TAB Stopped by: Shivangi Lutz, PA-C   valACYclovir 1000 MG tablet Commonly known as: Valtrex Stopped by: Tatiyanna Lashley, PA-C     TAKE these medications   Co Q10 200 MG Caps Take by mouth.   losartan-hydrochlorothiazide 100-25 MG tablet Commonly known as: HYZAAR Take 1 tablet by mouth daily.   magnesium oxide 400 MG tablet Commonly known as: MAG-OX Take by mouth.   QC TUMERIC COMPLEX PO Take by mouth.   rosuvastatin 5 MG tablet Commonly known as: CRESTOR Take 5 mg by mouth daily.       Allergies: No Known Allergies  Family History: No family history on file.  Social History:  reports that he has never smoked. He has never used smokeless tobacco. He reports current alcohol use. No history on file for drug.  ROS: UROLOGY Frequent Urination?: No Hard to postpone urination?: No Burning/pain with urination?: No Get up at night to urinate?: No Leakage of urine?: No  Urine stream starts and stops?: No Trouble starting stream?: No Do you have to strain to urinate?: No Blood in urine?: No Urinary tract infection?: No Sexually transmitted disease?: No Injury to kidneys or bladder?: No Painful intercourse?: No Weak stream?: No Erection problems?: No Penile pain?: No  Gastrointestinal Nausea?: No Vomiting?: No Indigestion/heartburn?: No Diarrhea?: No Constipation?: No  Constitutional Fever: No Night sweats?: No Weight loss?: No Fatigue?: No  Skin Skin rash/lesions?: No Itching?: No  Eyes Blurred vision?: No Double vision?:  No  Ears/Nose/Throat Sore throat?: No Sinus problems?: No  Hematologic/Lymphatic Swollen glands?: No Easy bruising?: No  Cardiovascular Leg swelling?: No Chest pain?: No  Respiratory Cough?: No Shortness of breath?: No  Endocrine Excessive thirst?: No  Musculoskeletal Back pain?: No Joint pain?: No  Neurological Headaches?: No Dizziness?: No  Psychologic Depression?: No Anxiety?: No  Physical Exam: BP (!) 150/98 (BP Location: Left Arm, Patient Position: Sitting, Cuff Size: Normal)   Pulse 76   Ht 5\' 9"  (1.753 m)   Wt 210 lb 1.6 oz (95.3 kg)   BMI 31.03 kg/m   Constitutional:  Well nourished. Alert and oriented, No acute distress. HEENT: [place] AT, moist mucus membranes.  Trachea midline, no masses. Cardiovascular: No clubbing, cyanosis, or edema. Respiratory: Normal respiratory effort, no increased work of breathing. GI: Abdomen is soft, non tender, non distended, no abdominal masses. Liver and spleen not palpable.  No hernias appreciated.  Stool sample for occult testing is not indicated.   GU: No CVA tenderness.  No bladder fullness or masses.  Patient with circumcised phallus.  Urethral meatus is patent.  No penile discharge. No penile lesions or rashes. Scrotum without lesions, cysts, rashes and/or edema.  Testicles are located scrotally bilaterally. No masses are appreciated in the testicles. Left and right epididymis are normal. Rectal: Patient with  normal sphincter tone. Anus and perineum without scarring or rashes. No rectal masses are appreciated. Prostate is approximately 50 grams, could only palpate apex and midportion of the gland, no nodules are appreciated.  Skin: No rashes, bruises or suspicious lesions. Lymph: No  inguinal adenopathy. Neurologic: Grossly intact, no focal deficits, moving all 4 extremities. Psychiatric: Normal mood and affect.  Laboratory Data: Lab Results  Component Value Date   WBC 16.4 (H) 08/10/2018   HGB 12.9 (L) 08/10/2018    HCT 37.9 (L) 08/10/2018   MCV 87.4 08/10/2018   PLT 205 08/10/2018    Lab Results  Component Value Date   CREATININE 1.25 10/13/2018    No results found for: PSA  No results found for: TESTOSTERONE  Lab Results  Component Value Date   HGBA1C 6.1 (H) 08/08/2018    Lab Results  Component Value Date   TSH 0.952 08/08/2018    No results found for: CHOL, HDL, CHOLHDL, VLDL, LDLCALC  Lab Results  Component Value Date   AST 21 08/08/2018   Lab Results  Component Value Date   ALT 16 08/08/2018   No components found for: ALKALINEPHOPHATASE No components found for: BILIRUBINTOTAL  No results found for: ESTRADIOL   I have reviewed the labs.   Pertinent Imaging: Results for KEEVIN, PANEBIANCO (MRN [id number]) as of 06/25/2019 10:36  Ref. Range 06/25/2019 10:23  Scan Result Unknown 19mL    Assessment & Plan:    1. History of elevated PSA  Associated with orchitis PSA has returned to baseline RTC in one year for PSA   2. Gross hematuria Resolved  Likely due to orchitis  3. History of urethral  stricture No urinary complaints and PVR is minimal  4. ED Patient is not interested in taking a medication at this time  No follow-ups on file.  These notes generated with voice recognition software. I apologize for typographical errors.  Michiel CowboySHANNON Brazos Sandoval, PA-C  [facility] [address]  [address] [place], [place] [address] [phone]

## 2019-06-25 ENCOUNTER — Other Ambulatory Visit
Admission: RE | Admit: 2019-06-25 | Discharge: 2019-06-25 | Disposition: A | Discharge status: Home / Self Care | Payer: Medicare HMO | Attending: Urology | Admitting: Urology

## 2019-06-25 ENCOUNTER — Other Ambulatory Visit: Payer: Self-pay

## 2019-06-25 ENCOUNTER — Ambulatory Visit (INDEPENDENT_AMBULATORY_CARE_PROVIDER_SITE_OTHER): Payer: Medicare HMO | Admitting: Urology

## 2019-06-25 ENCOUNTER — Encounter: Payer: Self-pay | Admitting: Urology

## 2019-06-25 VITALS — BP 150/98 | HR 76 | Ht 69.0 in | Wt 210.1 lb

## 2019-06-25 DIAGNOSIS — Z8042 Family history of malignant neoplasm of prostate: Secondary | ICD-10-CM

## 2019-06-25 DIAGNOSIS — Z87448 Personal history of other diseases of urinary system: Secondary | ICD-10-CM

## 2019-06-25 DIAGNOSIS — N138 Other obstructive and reflux uropathy: Secondary | ICD-10-CM

## 2019-06-25 DIAGNOSIS — N401 Enlarged prostate with lower urinary tract symptoms: Secondary | ICD-10-CM | POA: Diagnosis not present

## 2019-06-25 DIAGNOSIS — Z87898 Personal history of other specified conditions: Secondary | ICD-10-CM

## 2019-06-25 DIAGNOSIS — N529 Male erectile dysfunction, unspecified: Secondary | ICD-10-CM | POA: Diagnosis not present

## 2019-06-25 LAB — URINALYSIS, COMPLETE (UACMP) WITH MICROSCOPIC
Bacteria, UA: NONE SEEN
Bilirubin Urine: NEGATIVE
Glucose, UA: NEGATIVE mg/dL
Ketones, ur: NEGATIVE mg/dL
Nitrite: NEGATIVE
Protein, ur: NEGATIVE mg/dL
Specific Gravity, Urine: 1.02 (ref 1.005–1.030)
Squamous Epithelial / HPF: NONE SEEN (ref 0–5)
pH: 7 (ref 5.0–8.0)

## 2019-06-25 LAB — PSA: Prostatic Specific Antigen: 2.92 ng/mL (ref 0.00–4.00)

## 2019-06-25 LAB — BLADDER SCAN AMB NON-IMAGING

## 2019-10-30 DIAGNOSIS — I10 Essential (primary) hypertension: Secondary | ICD-10-CM | POA: Diagnosis not present

## 2019-10-30 DIAGNOSIS — E782 Mixed hyperlipidemia: Secondary | ICD-10-CM | POA: Diagnosis not present

## 2019-10-30 DIAGNOSIS — G4733 Obstructive sleep apnea (adult) (pediatric): Secondary | ICD-10-CM | POA: Diagnosis not present

## 2019-10-30 DIAGNOSIS — E119 Type 2 diabetes mellitus without complications: Secondary | ICD-10-CM | POA: Diagnosis not present

## 2020-04-02 DIAGNOSIS — I10 Essential (primary) hypertension: Secondary | ICD-10-CM | POA: Diagnosis not present

## 2020-04-02 DIAGNOSIS — Z809 Family history of malignant neoplasm, unspecified: Secondary | ICD-10-CM | POA: Diagnosis not present

## 2020-04-02 DIAGNOSIS — E785 Hyperlipidemia, unspecified: Secondary | ICD-10-CM | POA: Diagnosis not present

## 2020-04-02 DIAGNOSIS — E669 Obesity, unspecified: Secondary | ICD-10-CM | POA: Diagnosis not present

## 2020-04-02 DIAGNOSIS — Z008 Encounter for other general examination: Secondary | ICD-10-CM | POA: Diagnosis not present

## 2020-04-02 DIAGNOSIS — M1990 Unspecified osteoarthritis, unspecified site: Secondary | ICD-10-CM | POA: Diagnosis not present

## 2020-04-02 DIAGNOSIS — Z8249 Family history of ischemic heart disease and other diseases of the circulatory system: Secondary | ICD-10-CM | POA: Diagnosis not present

## 2020-04-02 DIAGNOSIS — Z6831 Body mass index (BMI) 31.0-31.9, adult: Secondary | ICD-10-CM | POA: Diagnosis not present

## 2020-04-02 DIAGNOSIS — Z7982 Long term (current) use of aspirin: Secondary | ICD-10-CM | POA: Diagnosis not present

## 2020-05-07 DIAGNOSIS — Z0000 Encounter for general adult medical examination without abnormal findings: Secondary | ICD-10-CM | POA: Diagnosis not present

## 2020-05-07 DIAGNOSIS — E669 Obesity, unspecified: Secondary | ICD-10-CM | POA: Diagnosis not present

## 2020-05-07 DIAGNOSIS — N1831 Chronic kidney disease, stage 3a: Secondary | ICD-10-CM | POA: Diagnosis not present

## 2020-05-07 DIAGNOSIS — Z6832 Body mass index (BMI) 32.0-32.9, adult: Secondary | ICD-10-CM | POA: Diagnosis not present

## 2020-05-07 DIAGNOSIS — I129 Hypertensive chronic kidney disease with stage 1 through stage 4 chronic kidney disease, or unspecified chronic kidney disease: Secondary | ICD-10-CM | POA: Diagnosis not present

## 2020-05-07 DIAGNOSIS — E1122 Type 2 diabetes mellitus with diabetic chronic kidney disease: Secondary | ICD-10-CM | POA: Diagnosis not present

## 2020-05-07 DIAGNOSIS — E782 Mixed hyperlipidemia: Secondary | ICD-10-CM | POA: Diagnosis not present

## 2020-05-07 DIAGNOSIS — G4733 Obstructive sleep apnea (adult) (pediatric): Secondary | ICD-10-CM | POA: Diagnosis not present

## 2020-06-27 ENCOUNTER — Other Ambulatory Visit
Admission: RE | Admit: 2020-06-27 | Discharge: 2020-06-27 | Disposition: A | Discharge status: Home / Self Care | Payer: Medicare HMO | Attending: Urology | Admitting: Urology

## 2020-06-27 ENCOUNTER — Other Ambulatory Visit: Payer: Self-pay | Admitting: *Deleted

## 2020-06-27 DIAGNOSIS — Z87898 Personal history of other specified conditions: Secondary | ICD-10-CM | POA: Diagnosis present

## 2020-06-27 LAB — PSA: Prostatic Specific Antigen: 2.77 ng/mL (ref 0.00–4.00)

## 2020-06-29 NOTE — Progress Notes (Signed)
06/30/2020 9:22 AM   Si Raider Boyte [birth year] [id number]  Referring provider: Marina Goodell, MD [address] [place],  [place] [address]  Chief Complaint  Patient presents with  . Follow-up    IPSS. PVR    HPI: Tom [name] is 69 y.o. male with a history of urethral stricture, history of orchitis, elevated PSA and BPH with LU TS who presents today for follow-up.  History of urethral stricture He has had a DVIU about 20 years ago in [place].  PVR is 59 mL.    History of orchitis Scrotal US on 08/08/2018 revealed no evidence of testicular mass or torsion.  Mildly hypervascular appearing testes bilaterally which could reflect a orchitis.  Complicated RIGHT hydrocele collection with multiple septations and debris raising question of a pyocele or hematocele.  LEFT varicocele present.  He was admitted overnight for IV antibiotics and observation.  He was then discharged with oral antibiotics.  At his visit on 09/14/2018, he stated his symptoms had completely resolved.  Exam was benign.  Srotal UA 09/2018 Resolution of the complicated RIGHT hydrocele collection seen on the previous exam, which could represent a pyocele or hematocele.  Unremarkable testes.  LEFT varicocele.  Elevated PSA PSA Trend  5.4 in 08/2018  4.3 in 09/2018  3.6 in 10/2018  3.7 in 02/2019  Component     Latest Ref Rng & Units 06/25/2019 06/27/2020  Prostatic Specific Antigen     0.00 - 4.00 ng/mL 2.92 2.77    His oldest brother was diagnosed with prostate cancer at age 12 and was treated with radiation.    Erectile dysfunction Refused to fill out SHIM score sheet.  He has been having difficulty with erections for a few years.   His major complaint is the erections too soft.  His libido is preserived.   His risk factors for ED are age, BPH, DM, HTN, HLD and sleep apnea.  He denies any painful erections or curvatures with his erections.   He is no longer having spontaneous erections.     BPH  WITH LUTS  (prostate and/or bladder) IPSS score: 1/0   PVR: 59 mL        Major complaint(s):  None.  Denies any dysuria, hematuria or suprapubic pain.    Denies any recent fevers, chills, nausea or vomiting.   IPSS    Row Name 06/30/20 0900         International Prostate Symptom Score   How often have you had the sensation of not emptying your bladder? Not at All     How often have you had to urinate less than every two hours? Not at All     How often have you found you stopped and started again several times when you urinated? Not at All     How often have you found it difficult to postpone urination? Not at All     How often have you had a weak urinary stream? Not at All     How often have you had to strain to start urination? Not at All     How many times did you typically get up at night to urinate? 1 Time     Total IPSS Score 1       Quality of Life due to urinary symptoms   If you were to spend the rest of your life with your urinary condition just the way it is now how would you feel about that? Delighted  Score:  1-7 Mild 8-19 Moderate 20-35 Severe   PMH: Past Medical History:  Diagnosis Date  . Hypertension     Surgical History: No past surgical history on file.  Home Medications:  Allergies as of 06/30/2020   No Known Allergies     Medication List       Accurate as of June 30, 2020  9:22 AM. If you have any questions, ask your nurse or doctor.        Co Q10 200 MG Caps Take by mouth.   losartan-hydrochlorothiazide 100-25 MG tablet Commonly known as: HYZAAR Take 1 tablet by mouth daily.   magnesium oxide 400 MG tablet Commonly known as: MAG-OX Take by mouth.   QC TUMERIC COMPLEX PO Take by mouth.   rosuvastatin 5 MG tablet Commonly known as: CRESTOR Take 5 mg by mouth daily.       Allergies: No Known Allergies  Family History: No family history on file.  Social History:  reports that he has never smoked. He has never  used smokeless tobacco. He reports current alcohol use. No history on file for drug use.  ROS: For pertinent review of systems please refer to history of present illness  Physical Exam: BP (!) 132/76   Pulse 91   Ht 5\' 9"  (1.753 m)   Wt (!) 218 lb (98.9 kg)   BMI 32.19 kg/m   Constitutional:  Well nourished. Alert and oriented, No acute distress. HEENT: [place] AT, mask in place.  Trachea midline Cardiovascular: No clubbing, cyanosis, or edema. Respiratory: Normal respiratory effort, no increased work of breathing. GI: Abdomen is soft, non tender, non distended, no abdominal masses. Liver and spleen not palpable.  No hernias appreciated.  Stool sample for occult testing is not indicated.   GU: No CVA tenderness.  No bladder fullness or masses.  Patient with circumcised phallus.  Urethral meatus is patent.  No penile discharge. No penile lesions or rashes. Scrotum without lesions, cysts, rashes and/or edema.  Testicles are located scrotally bilaterally. No masses are appreciated in the testicles. Left and right epididymis are normal. Rectal: Patient with  normal sphincter tone. Anus and perineum without scarring or rashes. No rectal masses are appreciated. Prostate is approximately 50 grams, could only palpate the apex and the midportion of the gland, no nodules are appreciated. Seminal vesicles could not be palpated Skin: No rashes, bruises or suspicious lesions. Lymph: No inguinal adenopathy. Neurologic: Grossly intact, no focal deficits, moving all 4 extremities. Psychiatric: Normal mood and affect.  Laboratory Data: Lab Results  Component Value Date   WBC 16.4 (H) 08/10/2018   HGB 12.9 (L) 08/10/2018   HCT 37.9 (L) 08/10/2018   MCV 87.4 08/10/2018   PLT 205 08/10/2018    Lab Results  Component Value Date   CREATININE 1.25 10/13/2018    Lab Results  Component Value Date   HGBA1C 6.1 (H) 08/08/2018    Lab Results  Component Value Date   TSH 0.952 08/08/2018    Lab Results   Component Value Date   AST 21 08/08/2018   Lab Results  Component Value Date   ALT 16 08/08/2018   I have reviewed the labs.   Pertinent Imaging: Results for ULYSEES, ROBARTS (MRN Dorthy Cooler) as of 06/30/2020 09:14  Ref. Range 06/30/2020 09:04  Scan Result Unknown 59 ml     Assessment & Plan:    1. BPH with LUTS IPSS score is 1/0 Continue conservative management, avoiding bladder irritants and timed voiding's RTC in  12 months for IPSS, PSA, PVR and exam   2. History of elevated PSA  Associated with orchitis PSA has returned to baseline RTC in one year for PSA   3. History of urethral stricture No urinary complaints and PVR is minimal  4. ED Patient is not interested in taking a medication at this time  Return in about 1 year (around 06/30/2021) for IPSS, PSA, PVR and exam.  These notes generated with voice recognition software. I apologize for typographical errors.  Michiel Cowboy, PA-C  [facility] [address]  [address] [place], [place] [address] [phone]

## 2020-06-30 ENCOUNTER — Other Ambulatory Visit: Payer: Self-pay

## 2020-06-30 ENCOUNTER — Encounter: Payer: Self-pay | Admitting: Urology

## 2020-06-30 ENCOUNTER — Ambulatory Visit: Payer: Medicare HMO | Admitting: Urology

## 2020-06-30 VITALS — BP 132/76 | HR 91 | Ht 69.0 in | Wt 218.0 lb

## 2020-06-30 DIAGNOSIS — N401 Enlarged prostate with lower urinary tract symptoms: Secondary | ICD-10-CM

## 2020-06-30 DIAGNOSIS — N138 Other obstructive and reflux uropathy: Secondary | ICD-10-CM

## 2020-06-30 DIAGNOSIS — N529 Male erectile dysfunction, unspecified: Secondary | ICD-10-CM | POA: Diagnosis not present

## 2020-06-30 LAB — BLADDER SCAN AMB NON-IMAGING: Scan Result: 59

## 2020-11-25 DIAGNOSIS — E1122 Type 2 diabetes mellitus with diabetic chronic kidney disease: Secondary | ICD-10-CM | POA: Diagnosis not present

## 2020-11-25 DIAGNOSIS — I10 Essential (primary) hypertension: Secondary | ICD-10-CM | POA: Diagnosis not present

## 2020-11-25 DIAGNOSIS — N1831 Chronic kidney disease, stage 3a: Secondary | ICD-10-CM | POA: Diagnosis not present

## 2020-11-25 DIAGNOSIS — E782 Mixed hyperlipidemia: Secondary | ICD-10-CM | POA: Diagnosis not present

## 2020-11-25 DIAGNOSIS — G4733 Obstructive sleep apnea (adult) (pediatric): Secondary | ICD-10-CM | POA: Diagnosis not present

## 2021-06-26 NOTE — Progress Notes (Signed)
Error

## 2021-06-29 ENCOUNTER — Encounter: Payer: Self-pay | Admitting: Urology

## 2021-06-29 ENCOUNTER — Other Ambulatory Visit
Admission: RE | Admit: 2021-06-29 | Discharge: 2021-06-29 | Disposition: A | Discharge status: Home / Self Care | Payer: Medicare HMO | Attending: Urology | Admitting: Urology

## 2021-06-29 ENCOUNTER — Ambulatory Visit (INDEPENDENT_AMBULATORY_CARE_PROVIDER_SITE_OTHER): Payer: Medicare HMO | Admitting: Urology

## 2021-06-29 ENCOUNTER — Other Ambulatory Visit: Payer: Self-pay

## 2021-06-29 ENCOUNTER — Other Ambulatory Visit: Payer: Self-pay | Admitting: *Deleted

## 2021-06-29 VITALS — BP 166/100 | HR 88 | Ht 69.0 in | Wt 224.0 lb

## 2021-06-29 DIAGNOSIS — N401 Enlarged prostate with lower urinary tract symptoms: Secondary | ICD-10-CM | POA: Diagnosis present

## 2021-06-29 DIAGNOSIS — N138 Other obstructive and reflux uropathy: Secondary | ICD-10-CM

## 2021-06-29 LAB — PSA: Prostatic Specific Antigen: 2.85 ng/mL (ref 0.00–4.00)

## 2021-06-29 LAB — BLADDER SCAN AMB NON-IMAGING

## 2021-06-29 NOTE — Progress Notes (Addendum)
06/29/2021 9:40 AM   Glena Norfolk Cadman [birth year] [id number]  Referring provider: Sofie Hartigan, MD [place],  [place] [address] Chief Complaint  Patient presents with   Follow-up    1 year follow-up    Urological history: 1. Urethral stricture -DVIU ~20 years ago   2. Orchitis  -scrotal ultrasound 2019 - revealed mildly hypervascular appearing tested bilaterally which could reflect a orchitis. Also revealed complicated right hydrocele collection with multiple septations and debris raising question of a pyocele or hematocele -scrotal ultrasound 1583 - complicated right hydrocele collection, which could represent pyocele or hematocele  3. Elevated PSA -PSA Trend 5.4 in 08/2018             4.3 in 09/2018             3.6 in 10/2018             3.7 in 02/2019  Component     Latest Ref Rng & Units 06/25/2019 06/27/2020  Prostatic Specific Antigen     0.00 - 4.00 ng/mL 2.92 2.77   4. ED -contributing factors of weight, age, , BPH, DM, HTN, HLD and sleep apnea  5. BPH with LU TS -I PSS 06/30/2020 1/0 -PVR 59 mL 07/31/2020 -managed   HPI: Tom [name] is a 70 y.o. male who presents today for a yearly exam.   Patient had an appointment with Dr Ellison Hughs 06/09/2021. Patient had elevated creatinine level of 1.6.    He states that he had burning during urination about 3 weeks ago. Within 2-3 days his symptoms had resolved through drinking water and cranberry juice.    IPSS 3 today, PVR 166 mL today.    IPSS     Row Name 06/29/21 0900         International Prostate Symptom Score   How often have you had the sensation of not emptying your bladder? Not at All     How often have you had to urinate less than every two hours? Not at All     How often have you found you stopped and started again several times when you urinated? Not at All     How often have you found it difficult to postpone urination? Less than 1 in 5 times     How often have you had  a weak urinary stream? Less than 1 in 5 times     How often have you had to strain to start urination? Not at All     How many times did you typically get up at night to urinate? 1 Time     Total IPSS Score 3           Quality of Life due to urinary symptoms     If you were to spend the rest of your life with your urinary condition just the way it is now how would you feel about that? Delighted             Score:  1-7 Mild 8-19 Moderate 20-35 Severe   PMH: Past Medical History:  Diagnosis Date   Hypertension     Surgical History: No past surgical history on file.  Home Medications:  Allergies as of 06/29/2021   No Known Allergies      Medication List        Accurate as of June 29, 2021  9:40 AM. If you have any questions, ask your nurse or doctor.  Co Q10 200 MG Caps Take by mouth.   losartan-hydrochlorothiazide 100-25 MG tablet Commonly known as: HYZAAR Take 1 tablet by mouth daily.   magnesium oxide 400 MG tablet Commonly known as: MAG-OX Take by mouth.   QC TUMERIC COMPLEX PO Take by mouth.   rosuvastatin 5 MG tablet Commonly known as: CRESTOR Take 5 mg by mouth daily.        Allergies: No Known Allergies  Family History: No family history on file.  Social History:  reports that he has never smoked. He has never used smokeless tobacco. He reports current alcohol use. No history on file for drug use.   Physical Exam: BP (!) 166/100   Pulse 88   Ht 5' 9"  (1.753 m)   Wt 224 lb (101.6 kg)   BMI 33.08 kg/m   Constitutional:  Alert and oriented, No acute distress. HEENT: [place] AT, mask in place.  Trachea midline Cardiovascular: No clubbing, cyanosis, or edema. Respiratory: Normal respiratory effort, no increased work of breathing. GU: No CVA tenderness Rectal: Normal sphincter tone,  60 CC prostate, smooth no nodules. Could only palpate the apex and the midportion of the gland  Lymph: No cervical or inguinal  lymphadenopathy. Neurologic: Grossly intact, no focal deficits, moving all 4 extremities. Psychiatric: Normal mood and affect.  Laboratory Data: Hemoglobin A1C 4.2 - 5.6 % 6.3 High    Average Blood Glucose (Calc) mg/dL 134   [facility] - LAB   Narrative Performed by [facility] - LAB Normal Range:    4.2 - 5.6%  Increased Risk:  5.7 - 6.4%  Diabetes:        >= 6.5%  Glycemic Control for adults with diabetes:  <7%   Specimen Collected: 06/09/21 08:42 Last Resulted: 06/09/21 12:37  Received From: [facility]  Result Received: 06/27/21 19:38   Cholesterol, Total 100 - 200 mg/dL 178   Triglyceride 35 - 199 mg/dL 182   HDL (High Density Lipoprotein) Cholesterol 29.0 - 71.0 mg/dL 36.4   LDL Calculated 0 - 130 mg/dL 105   VLDL Cholesterol mg/dL 36   Cholesterol/HDL Ratio  4.9   Resulting Agency  [facility] - LAB  Specimen Collected: 06/09/21 08:42 Last Resulted: 06/09/21 12:35  Received From: [facility]  Result Received: 06/27/21 19:38   Glucose 70 - 110 mg/dL 103   Sodium 136 - 145 mmol/L 139   Potassium 3.6 - 5.1 mmol/L 4.4   Chloride 97 - 109 mmol/L 104   Carbon Dioxide (CO2) 22.0 - 32.0 mmol/L 28.0   Urea Nitrogen (BUN) 7 - 25 mg/dL 27 High    Creatinine 0.7 - 1.3 mg/dL 1.6 High    Glomerular Filtration Rate (eGFR), MDRD Estimate >60 mL/min/1.73sq m 43 Low    Calcium 8.7 - 10.3 mg/dL 10.1   AST  8 - 39 U/L 27   ALT  6 - 57 U/L 31   Alk Phos (alkaline Phosphatase) 34 - 104 U/L 71   Albumin 3.5 - 4.8 g/dL 4.7   Bilirubin, Total 0.3 - 1.2 mg/dL 0.5   Protein, Total 6.1 - 7.9 g/dL 7.3   A/G Ratio 1.0 - 5.0 gm/dL 1.8   Resulting Agency  [facility] - LAB  Specimen Collected: 06/09/21 08:42 Last Resulted: 06/09/21 13:11  Received From: [facility]  Result Received: 06/27/21 19:38   Pertinent Imaging:  Results for orders placed or performed in visit on 06/29/21   BLADDER  SCAN AMB NON-IMAGING  Result Value Ref Range   Scan Result 126m        Assessment & Plan:    BPH WITH LUTS  - PSA pending  -DRE normal  - IPSS score 3/0  - PVR 166 mL   2. History of elevated PSA   History of urethral stricture   Erectile dysfunction  - Does not desire treatment    Follow-up in 1 year for exam, PSA and I PSS   I,Kailey Littlejohn,acting as a scribe for SY[facility]MCGOWAN, PA-C.,have documented all relevant documentation on the behalf of Jacksen Isip, PA-C,as directed by  [facility] PA-C while in the presence of Curlee Bogan, PA-C.    [place] [place] [phone]

## 2022-07-05 ENCOUNTER — Ambulatory Visit: Payer: Medicare HMO | Admitting: Urology

## 2022-07-14 ENCOUNTER — Other Ambulatory Visit: Payer: Self-pay

## 2022-07-14 ENCOUNTER — Other Ambulatory Visit
Admission: RE | Admit: 2022-07-14 | Discharge: 2022-07-14 | Disposition: A | Discharge status: Home / Self Care | Payer: Medicare HMO | Attending: Urology | Admitting: Urology

## 2022-07-14 DIAGNOSIS — N138 Other obstructive and reflux uropathy: Secondary | ICD-10-CM | POA: Diagnosis present

## 2022-07-14 DIAGNOSIS — N401 Enlarged prostate with lower urinary tract symptoms: Secondary | ICD-10-CM | POA: Insufficient documentation

## 2022-07-14 LAB — PSA: Prostatic Specific Antigen: 2.55 ng/mL (ref 0.00–4.00)

## 2022-07-19 ENCOUNTER — Ambulatory Visit: Payer: Medicare HMO | Admitting: Urology

## 2022-07-19 ENCOUNTER — Encounter: Payer: Self-pay | Admitting: Urology

## 2022-07-19 VITALS — BP 120/70 | HR 89 | Ht 69.0 in | Wt 224.0 lb

## 2022-07-19 DIAGNOSIS — N401 Enlarged prostate with lower urinary tract symptoms: Secondary | ICD-10-CM | POA: Diagnosis not present

## 2022-07-19 DIAGNOSIS — N138 Other obstructive and reflux uropathy: Secondary | ICD-10-CM

## 2022-07-19 NOTE — Progress Notes (Signed)
07/19/22 12:22 PM   Tom [name] 01/20/1951 [id number]  Referring provider:  Sofie Hartigan, [place],  [place] [address]  Chief Complaint  Patient presents with   Benign Prostatic Hypertrophy    1year follow up    Urological history  1. Urethral stricture -DVIU ~20 years ago    2. Orchitis  -scrotal ultrasound 2019 - revealed mildly hypervascular appearing tested bilaterally which could reflect a orchitis. Also revealed complicated right hydrocele collection with multiple septations and debris raising question of a pyocele or hematocele -scrotal ultrasound 7106 - complicated right hydrocele collection, which could represent pyocele or hematocele   3. Elevated PSA -PSA Trend 5.4 in 08/2018             4.3 in 09/2018             3.6 in 10/2018             3.7 in 02/2019   Prostatic Specific Antigen  Latest Ref Rng 0.00 - 4.00 ng/mL  06/25/2019 2.92   06/27/2020 2.77   06/29/2021 2.85   07/14/2022 2.55     4. ED -contributing factors of weight, age, , BPH, DM, HTN, HLD and sleep apnea   5. BPH with LU TS -I PSS 3/0  HPI: Tom [name] is a 71 y.o.male who presents today for a 1 year follow-up with IPSS, PSA, and exam.  He has no complaints at this time.  Patient denies any modifying or aggravating factors.  Patient denies any gross hematuria, dysuria or suprapubic/flank pain.  Patient denies any fevers, chills, nausea or vomiting.    IPSS     Row Name 07/19/22 1100         International Prostate Symptom Score   How often have you had the sensation of not emptying your bladder? Not at All     How often have you had to urinate less than every two hours? Not at All     How often have you found you stopped and started again several times when you urinated? Not at All     How often have you found it difficult to postpone urination? Less than 1 in 5 times     How often have you had a weak urinary stream? Less than 1 in 5 times     How  often have you had to strain to start urination? Not at All     How many times did you typically get up at night to urinate? 1 Time     Total IPSS Score 3       Quality of Life due to urinary symptoms   If you were to spend the rest of your life with your urinary condition just the way it is now how would you feel about that? Delighted              Score:  1-7 Mild 8-19 Moderate 20-35 Severe  PMH: Past Medical History:  Diagnosis Date   Hypertension     Surgical History: No past surgical history on file.  Home Medications:  Allergies as of 07/19/2022   No Known Allergies      Medication List        Accurate as of July 19, 2022 12:22 PM. If you have any questions, ask your nurse or doctor.          Co Q10 200 MG Caps Take by mouth.   losartan-hydrochlorothiazide 100-25 MG tablet Commonly known  asKonrad Penta Take 1 tablet by mouth daily.   magnesium oxide 400 MG tablet Commonly known as: MAG-OX Take by mouth.   QC TUMERIC COMPLEX PO Take by mouth.   rosuvastatin 5 MG tablet Commonly known as: CRESTOR Take 5 mg by mouth daily.        Allergies:  No Known Allergies  Family History: No family history on file.  Social History:  reports that he has never smoked. He has never used smokeless tobacco. He reports current alcohol use. No history on file for drug use.   Physical Exam: BP 120/70   Pulse 89   Ht _0  (1.753 m)   Wt 224 lb (101.6 kg)   BMI 33.08 kg/m   Constitutional:  Alert and oriented, No acute distress. HEENT: [place] AT, moist mucus membranes.  Trachea midline Cardiovascular: No clubbing, cyanosis, or edema. Respiratory: Normal respiratory effort, no increased work of breathing. GU:  No CVA tenderness.  No bladder fullness or masses.  Patient with circumcised phallus. Urethral meatus is patent.  No penile discharge. No penile lesions or rashes. Scrotum without lesions, cysts, rashes and/or edema.  Bilateral hydroceles, testicles are  located scrotally bilaterally. No masses are appreciated in the testicles. Left and right epididymis are normal. Rectal: Patient with  normal sphincter tone. Anus and perineum without scarring or rashes. No rectal masses are appreciated. Prostate is approximately 50 grams, could only palpate the apex, no nodules are appreciated. Seminal vesicles could not be palpated Neurologic: Grossly intact, no focal deficits, moving all 4 extremities. Psychiatric: Normal mood and affect.  Laboratory Data: Glucose 70 - 110 mg/dL 103   Sodium 136 - 145 mmol/L 138   Potassium 3.6 - 5.1 mmol/L 4.8   Chloride 97 - 109 mmol/L 104   Carbon Dioxide (CO2) 22.0 - 32.0 mmol/L 29.0   Urea Nitrogen (BUN) 7 - 25 mg/dL 26 High    Creatinine 0.7 - 1.3 mg/dL 1.2   Glomerular Filtration Rate (eGFR), MDRD Estimate >60 mL/min/1.73sq m 60 Low    Calcium 8.7 - 10.3 mg/dL 9.8   AST  8 - 39 U/L 14   ALT  6 - 57 U/L 13   Alk Phos (alkaline Phosphatase) 34 - 104 U/L 69   Albumin 3.5 - 4.8 g/dL 4.6   Bilirubin, Total 0.3 - 1.2 mg/dL 0.7   Protein, Total 6.1 - 7.9 g/dL 7.0   A/G Ratio 1.0 - 5.0 gm/dL 1.9   Resulting Agency  [facility]   Specimen Collected: 07/07/22 10:36   Performed by: [place]: 07/07/22 16:13  Received From: [facility]  Result Received: 07/14/22 09:24    Ref Range & Units 12 d ago  Hemoglobin A1C 4.2 - 5.6 % 6.8 High    Average Blood Glucose (Calc) mg/dL 148   Resulting [place] [facility]  Narrative Performed by [facility] Normal Range:    4.2 - 5.6%  Increased Risk:  5.7 - 6.4%  Diabetes:        >= 6.5%  Glycemic Control for adults with diabetes:  <7%    Specimen Collected: 07/07/22 10:36    Ref Range & Units 5 mo ago  Sodium 136 - 145 mmol/L 143   Potassium 3.5 - 5.1 mmol/L 4.7   Chloride 98 - 107 mmol/L 106   CO2 20.0 - 31.0 mmol/L 29.0   Anion Gap 3 - 11 mmol/L 8  BUN 9 - 23 mg/dL 21    Creatinine 0.60 - 1.10 mg/dL 1.35 High    BUN/Creatinine Ratio  16   eGFR CKD-EPI (2021) Male >=60 mL/min/1.16m 57 Low    Comment: eGFR calculated with CKD-EPI 2021 equation in accordance with [employer] Neph[employer] Task Force recommendations.  Glucose 70 - 179 mg/dL 96   Calcium 8.7 - 10.4 mg/dL 8.9   [facility]   Specimen Collected: 01/26/22 05:36   [facility]formed by: [facility] Last Resulted: 01/26/22 06:30  [facility] Result Received: 06/28/22 11:43  I have reviewed the labs.    Assessment & Plan:    BPH WITH LUTS  - PSA 2.55 on 07/14/2022  -DRE normal   2. History of elevated PSA  - PSA WNL  Return in about 1 year (around 07/20/2023) for IPSS, PSA and exam.  [address] S[place]ford[place] [phone] I,Kailey Littlejohn,acting as a scribe for SDe[facility] PA-C.,have documented all relevant documentation on the behalf of Annabella Elford, PA-C,as directed by  [facility] PA-C while in the presence of Catelin Manthe, PA-C.

## 2023-07-15 ENCOUNTER — Other Ambulatory Visit: Payer: Self-pay | Admitting: Urology

## 2023-07-15 DIAGNOSIS — N138 Other obstructive and reflux uropathy: Secondary | ICD-10-CM

## 2023-07-15 NOTE — Progress Notes (Unsigned)
07/18/23 11:03 AM   [name] 03/19/1951 [id number]  Referring provider:  Marina Goodell, MD [address] [place],  [place] [address]  Urological history  1. Urethral stricture -DVIU ~20 years ago    2. Orchitis  -scrotal ultrasound 2019 - revealed mildly hypervascular appearing tested bilaterally which could reflect a orchitis. Also revealed complicated right hydrocele collection with multiple septations and debris raising question of a pyocele or hematocele -scrotal ultrasound 2019 - complicated right hydrocele collection, which could represent pyocele or hematocele   3. Elevated PSA -PSA 5.4 in 08/2018             4. ED -contributing factors of weight, age, , BPH, DM, HTN, HLD and sleep apnea   5. BPH with LU TS -PSA pending  Chief Complaint  Patient presents with   Follow-up     HPI: Tom [name] is a 72 y.o.male who presents today for a 1 year follow-up.  Previous records reviewed.    I PSS 3/0  His PCP drew his PSA this am.  No urinary complaints.  Patient denies any modifying or aggravating factors.  Patient denies any recent UTI's, gross hematuria, dysuria or suprapubic/flank pain.  Patient denies any fevers, chills, nausea or vomiting.     IPSS     Row Name 07/18/23 1000         International Prostate Symptom Score   How often have you had the sensation of not emptying your bladder? Not at All     How often have you had to urinate less than every two hours? Less than 1 in 5 times     How often have you found you stopped and started again several times when you urinated? Not at All     How often have you found it difficult to postpone urination? Less than 1 in 5 times     How often have you had a weak urinary stream? Not at All     How often have you had to strain to start urination? Not at All     How many times did you typically get up at night to urinate? 1 Time     Total IPSS Score 3       Quality of Life due to urinary symptoms    If you were to spend the rest of your life with your urinary condition just the way it is now how would you feel about that? Delighted               Score:  1-7 Mild 8-19 Moderate 20-35 Severe  PMH: Past Medical History:  Diagnosis Date   Hypertension     Surgical History: No past surgical history on file.  Home Medications:  Allergies as of 07/18/2023   No Known Allergies      Medication List        Accurate as of July 18, 2023 11:03 AM. If you have any questions, ask your nurse or doctor.          STOP taking these medications    losartan-hydrochlorothiazide 100-25 MG tablet Commonly known as: HYZAAR Stopped by: Michiel Cowboy       TAKE these medications    aspirin EC 81 MG tablet Take 81 mg by mouth daily.   Co Q10 200 MG Caps Take by mouth.   hydrochlorothiazide 25 MG tablet Commonly known as: HYDRODIURIL Take 1 tablet by mouth daily.   losartan 100 MG tablet Commonly known as:  COZAAR Take 100 mg by mouth daily.   magnesium oxide 400 MG tablet Commonly known as: MAG-OX Take 400 mg by mouth daily. What changed: Another medication with the same name was removed. Continue taking this medication, and follow the directions you see here. Changed by: Michiel Cowboy   QC TUMERIC COMPLEX PO Take by mouth.   rosuvastatin 5 MG tablet Commonly known as: CRESTOR Take 5 mg by mouth daily.        Allergies:  No Known Allergies  Family History: No family history on file.  Social History:  reports that he has never smoked. He has never used smokeless tobacco. He reports current alcohol use. No history on file for drug use.   Physical Exam: BP 124/80 (BP Location: Left Arm, Patient Position: Sitting, Cuff Size: Large)   Pulse 92   Ht 5\' 9"  (1.753 m)   Wt 215 lb (97.5 kg)   BMI 31.75 kg/m   Constitutional:  Well nourished. Alert and oriented, No acute distress. HEENT: [place] AT, moist mucus membranes.  Trachea  midline Cardiovascular: No clubbing, cyanosis, or edema. Respiratory: Normal respiratory effort, no increased work of breathing. GU: No CVA tenderness.  No bladder fullness or masses.  Patient with uncircumcised phallus. Foreskin easily retracted  Urethral meatus is patent.  No penile discharge. No penile lesions or rashes. Scrotum without lesions, cysts, rashes and/or edema.  Testicles are located scrotally bilaterally. No masses are appreciated in the testicles. Left and right epididymis are normal. Rectal: Patient with  normal sphincter tone. Anus and perineum without scarring or rashes. No rectal masses are appreciated. Prostate is approximately 50 grams, could not palpate the entire gland, no nodules are appreciated. Seminal vesicles could not be palpated.  Neurologic: Grossly intact, no focal deficits, moving all 4 extremities. Psychiatric: Normal mood and affect.   Laboratory Data: Comprehensive Metabolic Panel (CMP) Order: [id number] Component Ref Range & Units 6 mo ago  Glucose 70 - 110 mg/dL 132  Sodium 440 - 102 mmol/L 137  Potassium 3.6 - 5.1 mmol/L 4.6  Chloride 97 - 109 mmol/L 103  Carbon Dioxide (CO2) 22.0 - 32.0 mmol/L 28.0  Urea Nitrogen (BUN) 7 - 25 mg/dL 23  Creatinine 0.7 - 1.3 mg/dL 1.2  Glomerular Filtration Rate (eGFR) >60 mL/min/1.73sq m 65  Comment: CKD-EPI (2021) does not include patient's race in the calculation of eGFR.  Monitoring changes of plasma creatinine and eGFR over time is useful for monitoring kidney function.  Interpretive Ranges for eGFR (CKD-EPI 2021):  eGFR:       >60 mL/min/1.73 sq. m - Normal eGFR:       30-59 mL/min/1.73 sq. m - Moderately Decreased eGFR:       15-29 mL/min/1.73 sq. m  - Severely Decreased eGFR:       < 15 mL/min/1.73 sq. m  - Kidney Failure   Note: These eGFR calculations do not apply in acute situations when eGFR is changing rapidly or patients on dialysis.  Calcium 8.7 - 10.3 mg/dL 72.5  AST 8 - 39 U/L 16   ALT 6 - 57 U/L 14  Alk Phos (alkaline Phosphatase) 34 - 104 U/L 73  Albumin 3.5 - 4.8 g/dL 4.5  Bilirubin, Total 0.3 - 1.2 mg/dL 0.5  Protein, Total 6.1 - 7.9 g/dL 6.9  A/G Ratio 1.0 - 5.0 gm/dL 1.9  Resulting Agency [facility]   Specimen Collected: 01/10/23 09:50   Performed by: [facility] Last Resulted: 01/10/23 16:18  Received From: [facility]  Result Received: 07/15/23 16:41   Lipid Panel w/calc LDL Order: [id number] Component Ref Range & Units 6 mo ago  Cholesterol, Total 100 - 200 mg/dL 841  Triglyceride 35 - 199 mg/dL 324  HDL (High Density Lipoprotein) Cholesterol 29.0 - 71.0 mg/dL 40.1  LDL Calculated 0 - 130 mg/dL 82  VLDL Cholesterol mg/dL 35  Cholesterol/HDL Ratio 3.9  Resulting Agency [facility]   Specimen Collected: 01/10/23 09:50   Performed by: [facility] Last Resulted: 01/10/23 16:18  Received From: [facility]  Result Received: 07/15/23 16:41   Hemoglobin A1C Order: [id number] Component Ref Range & Units 6 mo ago  Hemoglobin A1C 4.2 - 5.6 % 6.6 High   Average Blood Glucose (Calc) mg/dL 403  Resulting Agency [facility]  Narrative Performed by [facility] Normal Range:    4.2 - 5.6% Increased Risk:  5.7 - 6.4% Diabetes:        >= 6.5% Glycemic Control for adults with diabetes:  <7%    Specimen Collected: 01/10/23 09:50   Performed by: [facility] Last Resulted: 01/10/23 12:42  Received From: [facility]  Result Received: 07/15/23 16:41  I have reviewed the labs.   Pertinent Imaging: N/A   Assessment & Plan:    BPH WITH LUTS  -PSA pending -DRE normal    Return in about 1 year (around 07/17/2024) for I PSS and exam .  Michiel Cowboy, PA-C   [facility] [address], [address] [place], [place] [address] [phone]

## 2023-07-18 ENCOUNTER — Encounter: Payer: Self-pay | Admitting: Urology

## 2023-07-18 ENCOUNTER — Ambulatory Visit: Payer: Medicare HMO | Admitting: Urology

## 2023-07-18 VITALS — BP 124/80 | HR 92 | Ht 69.0 in | Wt 215.0 lb

## 2023-07-18 DIAGNOSIS — N401 Enlarged prostate with lower urinary tract symptoms: Secondary | ICD-10-CM | POA: Diagnosis not present

## 2023-07-18 DIAGNOSIS — N138 Other obstructive and reflux uropathy: Secondary | ICD-10-CM

## 2024-04-16 ENCOUNTER — Encounter: Payer: Self-pay | Admitting: Urology

## 2024-07-23 ENCOUNTER — Ambulatory Visit: Admitting: Urology

## 2024-07-23 ENCOUNTER — Ambulatory Visit: Payer: Self-pay | Admitting: Urology

## 2024-08-24 NOTE — Progress Notes (Signed)
 08/27/24 4:05 PM   Lonni LABOR Mooney [birth year] [id number]  Referring provider:  Jeffie Cheryl BRAVO, MD [address] [place],  [place] [address]  Urological history  1. Urethral stricture -DVIU ~20 years ago    2. Orchitis  -scrotal ultrasound 2019 - revealed mildly hypervascular appearing tested bilaterally which could reflect a orchitis. Also revealed complicated right hydrocele collection with multiple septations and debris raising question of a pyocele or hematocele -scrotal ultrasound 2019 - complicated right hydrocele collection, which could represent pyocele or hematocele   3. Elevated PSA -PSA 5.4 in 08/2018             4. ED -contributing factors of weight, age, , BPH, DM, HTN, HLD and sleep apnea   5. BPH with LU TS -PSA (06/2024) 4.32  Chief Complaint  Patient presents with   Follow-up   Benign Prostatic Hypertrophy     HPI: Tom [name] is a 73 y.o.male who presents today for a 1 year follow-up.  Previous records reviewed.    I PSS 2/0  He has some mild urgency and a mild weak stream.  Patient denies any modifying or aggravating factors.  Patient denies any recent UTI's, gross hematuria, dysuria or suprapubic/flank pain.  Patient denies any fevers, chills, nausea or vomiting.    He has a family history of PCa  UA with an older brother who is prostate cancer was treated with radiation in a younger brother who is prostate cancer was treated with prostatectomy.  His youngest brother may have died from renal cell carcinoma by the patient's description, though the patient did not explicitly say it was renal cell carcinoma.  PVR 18 mL   PSA  (06/2024) 4.32  Serum creatinine (06/2024) 1.4, eGFR 54  Hemoglobin A1c (06/2024) 6.3  PMH: Past Medical History:  Diagnosis Date   Hypertension     Surgical History: No past surgical history on file.  Home Medications:  Allergies as of 08/27/2024   No Known Allergies      Medication List         Accurate as of August 27, 2024  4:05 PM. If you have any questions, ask your nurse or doctor.          aspirin EC 81 MG tablet Take 81 mg by mouth daily.   Co Q10 200 MG Caps Take by mouth.   hydrochlorothiazide  25 MG tablet Commonly known as: HYDRODIURIL  Take 1 tablet by mouth daily.   losartan  100 MG tablet Commonly known as: COZAAR  Take 100 mg by mouth daily.   magnesium oxide 400 MG tablet Commonly known as: MAG-OX Take 400 mg by mouth daily.   QC TUMERIC COMPLEX PO Take by mouth.   rosuvastatin  5 MG tablet Commonly known as: CRESTOR  Take 5 mg by mouth daily.        Allergies:  No Known Allergies  Family History: No family history on file.  Social History:  reports that he has never smoked. He has never used smokeless tobacco. He reports current alcohol use. No history on file for drug use.   Physical Exam: BP 120/78 (BP Location: Left Arm, Patient Position: Sitting, Cuff Size: Normal)   Pulse 96   Wt 220 lb (99.8 kg)   SpO2 96%   BMI 32.49 kg/m   Constitutional:  Well nourished. Alert and oriented, No acute distress. HEENT:  AT, moist mucus membranes.  Trachea midline Cardiovascular: No clubbing, cyanosis, or edema. Respiratory: Normal respiratory effort, no increased work of  breathing. GU: No CVA tenderness.  No bladder fullness or masses.  Patient with circumcised phallus. Urethral meatus is patent.  No penile discharge. No penile lesions or rashes. Scrotum without lesions, cysts, rashes and/or edema.  Testicles are located scrotally bilaterally. No masses are appreciated in the testicles. Left and right epididymis are normal. Rectal: Patient with  normal sphincter tone. Anus and perineum without scarring or rashes. No rectal masses are appreciated. Prostate is approximately 50 + grams, could not palpate the entire gland, no nodules are appreciated. Seminal vesicles could not be palpated Neurologic: Grossly intact, no focal deficits, moving  all 4 extremities. Psychiatric: Normal mood and affect.   Laboratory Data: See EPIC and HPI I have reviewed the labs.   Pertinent Imaging:  08/27/24 15:37  Scan Result 18mL     Assessment & Plan:    1. Elevated PSA -discussed with patient that with a newly elevated PSA, 25% to 40% of the time it will return to baseline levels and for that reason it is advised to repeat the levels  -We reviewed the implications of an elevated PSA and the uncertainty surrounding it. In general, a man's PSA increases with age and is produced by both normal and cancerous prostate tissue. -The differential diagnosis for elevated PSA includes BPH, prostate cancer, infection, recent intercourse/ejaculation, recent urethroscopic manipulation (foley placement/cystoscopy) or trauma, and prostatitis  -Management of an elevated PSA can include observation or prostate biopsy and we discussed this in detail. Our goal is to detect clinically significant prostate cancers, and manage with either active surveillance, surgery, or radiation for localized disease. Risks of prostate biopsy include bleeding, infection (including life threatening sepsis), pain, and lower urinary symptoms. Hematuria, hematospermia, and blood in the stool are all common after biopsy and can persist up to 4 weeks   2. BPH WITH LUTS  -free and total PSA pending -DRE normal   Return for Follow up pending labs.  CLOTILDA HELON RIGGERS   [facility] [address], [address] [place], [place] [address] [phone]

## 2024-08-27 ENCOUNTER — Encounter: Payer: Self-pay | Admitting: Urology

## 2024-08-27 ENCOUNTER — Other Ambulatory Visit
Admission: RE | Admit: 2024-08-27 | Discharge: 2024-08-27 | Disposition: A | Discharge status: Home / Self Care | Attending: Urology | Admitting: Urology

## 2024-08-27 ENCOUNTER — Other Ambulatory Visit: Payer: Self-pay

## 2024-08-27 ENCOUNTER — Ambulatory Visit: Admitting: Urology

## 2024-08-27 VITALS — BP 120/78 | HR 96 | Wt 220.0 lb

## 2024-08-27 DIAGNOSIS — N138 Other obstructive and reflux uropathy: Secondary | ICD-10-CM

## 2024-08-27 DIAGNOSIS — N401 Enlarged prostate with lower urinary tract symptoms: Secondary | ICD-10-CM

## 2024-08-27 DIAGNOSIS — R972 Elevated prostate specific antigen [PSA]: Secondary | ICD-10-CM | POA: Diagnosis not present

## 2024-08-27 LAB — BLADDER SCAN AMB NON-IMAGING

## 2024-08-28 ENCOUNTER — Ambulatory Visit: Payer: Self-pay | Admitting: Urology

## 2024-08-28 LAB — PSA, TOTAL AND FREE
PSA, Free Pct: 22.2 %
PSA, Free: 0.91 ng/mL
Prostate Specific Ag, Serum: 4.1 ng/mL — ABNORMAL HIGH (ref 0.0–4.0)

## 2024-08-28 NOTE — Telephone Encounter (Signed)
Pt returned your call and would like for you to call him back.  

## 2024-08-29 ENCOUNTER — Telehealth: Payer: Self-pay | Admitting: Urology

## 2024-08-29 ENCOUNTER — Other Ambulatory Visit: Payer: Self-pay | Admitting: Urology

## 2024-08-29 DIAGNOSIS — R972 Elevated prostate specific antigen [PSA]: Secondary | ICD-10-CM

## 2024-08-29 DIAGNOSIS — N138 Other obstructive and reflux uropathy: Secondary | ICD-10-CM

## 2024-08-29 NOTE — Progress Notes (Signed)
 I have spoken with Tom [name] and have recommended that he have a prostate MRI for further evaluation of his PSA elevated. Orders in.

## 2024-08-29 NOTE — Telephone Encounter (Signed)
I left a message for him to call me back

## 2024-09-04 ENCOUNTER — Ambulatory Visit
Admission: RE | Admit: 2024-09-04 | Discharge: 2024-09-04 | Disposition: A | Discharge status: Home / Self Care | Source: Ambulatory Visit | Attending: Urology | Admitting: Urology

## 2024-09-04 DIAGNOSIS — N401 Enlarged prostate with lower urinary tract symptoms: Secondary | ICD-10-CM | POA: Insufficient documentation

## 2024-09-04 DIAGNOSIS — N138 Other obstructive and reflux uropathy: Secondary | ICD-10-CM | POA: Diagnosis present

## 2024-09-04 DIAGNOSIS — R972 Elevated prostate specific antigen [PSA]: Secondary | ICD-10-CM | POA: Insufficient documentation

## 2024-09-04 MED ORDER — GADOBUTROL 1 MMOL/ML IV SOLN
9.0000 mL | Freq: Once | INTRAVENOUS | Status: AC | PRN
  Administered 2024-09-04: 9 mL via INTRAVENOUS

## 2024-09-17 NOTE — Progress Notes (Unsigned)
 09/18/24 12:44 PM   Tom [name] 14-Aug-1951 [id number]  Referring provider:  Jeffie Cheryl BRAVO, MD [address] [place],  [place] [address]  Urological history  1. Urethral stricture -DVIU ~20 years ago    2. Orchitis  -scrotal ultrasound 2019 - revealed mildly hypervascular appearing tested bilaterally which could reflect a orchitis. Also revealed complicated right hydrocele collection with multiple septations and debris raising question of a pyocele or hematocele -scrotal ultrasound 2019 - complicated right hydrocele collection, which could represent pyocele or hematocele   3. Elevated PSA -PSA 5.4 in 08/2018             4. ED -contributing factors of weight, age, , BPH, DM, HTN, HLD and sleep apnea   5. BPH with LU TS -PSA (06/2024) 4.32  Chief Complaint  Patient presents with   Follow-up     follow up for prostate MRI results    HPI: Tom [name] is a 73 y.o.male who presents today for prostate MRI report.    Previous records reviewed.     His PSA increased from 2.96 last year, to 4.32 this year.  Repeated PSA stayed at 4.0, one month later.  We decided to pursue a prostate MRI for further evaluation of this elevation in his PSA.  Prostate MRI completed on September 04, 2024 did not find any findings suspicious for clinically significant prostate cancer.  Prostate volume was approximately 40 cc making his PSAD 0.10.    PMH: Past Medical History:  Diagnosis Date   Hypertension     Surgical History: No past surgical history on file.  Home Medications:  Allergies as of 09/18/2024   No Known Allergies      Medication List        Accurate as of September 18, 2024 12:44 PM. If you have any questions, ask your nurse or doctor.          aspirin EC 81 MG tablet Take 81 mg by mouth daily.   Co Q10 200 MG Caps Take by mouth.   hydrochlorothiazide  25 MG tablet Commonly known as: HYDRODIURIL  Take 1 tablet by mouth daily.   losartan  100 MG  tablet Commonly known as: COZAAR  Take 100 mg by mouth daily.   magnesium oxide 400 MG tablet Commonly known as: MAG-OX Take 400 mg by mouth daily.   QC TUMERIC COMPLEX PO Take by mouth.   rosuvastatin  5 MG tablet Commonly known as: CRESTOR  Take 5 mg by mouth daily.        Allergies:  No Known Allergies  Family History: No family history on file.  Social History:  reports that he has never smoked. He has never used smokeless tobacco. He reports current alcohol use. No history on file for drug use.   Physical Exam: BP (!) 146/91   Pulse 96   Ht 5' 9 (1.753 m)   Wt 220 lb (99.8 kg)   BMI 32.49 kg/m   Constitutional:  Well nourished. Alert and oriented, No acute distress. HEENT: [place] AT, moist mucus membranes.  Trachea midline Cardiovascular: No clubbing, cyanosis, or edema. Respiratory: Normal respiratory effort, no increased work of breathing. Neurologic: Grossly intact, no focal deficits, moving all 4 extremities. Psychiatric: Normal mood and affect.   Laboratory Data: See EPIC and HPI I have reviewed the labs.   Pertinent Imaging: EXAM: MRI PROSTATE WITH AND WITHOUT INTRAVENOUS CONTRAST 09/04/2024 09:18:58 AM   TECHNIQUE: Multiparametric MRI imaging of the prostate with and without dynamic contrast enhanced imaging  and diffusion weighted imaging was performed. 9 mL gadobutrol (GADAVIST) 1 MMOL/ML injection was administered.   Dynacad was utilized in analysis of images.   COMPARISON: None available.   CLINICAL HISTORY: Prostate cancer suspected. Elevated PSA 4.1 on 9/29. 9mL gadavist. Pt preformed all prep including enema. Pt voided prior to MRI.   FINDINGS:   PROSTATE: Heterogeneous signal intensity with peripheral T2-weighted imaging without focal lesion (series 4). No foci of restricted diffusion within the peripheral zone. The transitional zone is enlarged by encapsulated nodules without suspicious imaging characteristics on T2-weighted  imaging. Prostate volume is approximately 39.1 cm. PI-RADS 2.   SEMINAL VESICLES: Unremarkable.   NEUROVASCULAR BUNDLE: Unremarkable.   LYMPHADENOPATHY: No lymphadenopathy.   BLADDER: The bladder is unremarkable.   BOWEL: Left colon diverticulosis. The visualized bowel is otherwise without acute abnormality.   PERITONEAL CAVITY: No free fluid.   BONES: Normal bone marrow signal intensity. No suspicious or aggressive osseous lesions.   SOFT TISSUES: No focal abnormality.   IMPRESSION: 1. No findings of intermediate or high grade neoplasia. PI-RADS 2. 2. Estimated prostate volume of 39.1 cm.   Electronically signed by: Norleen Boxer MD 09/05/2024 04:41 PM EDT RP Workstation: [id number] I have independently reviewed the films.  See HPI.     Assessment & Plan:    1. Elevated PSA -prostate MRI did not identify any lesions suspicious for clinically significant prostate cancer -PSA density of 0.10 is also reassuring, along with normal DRE  -Explained that prostate MRIs can miss clinically significant prostate cancer 20% of the time, so would recommend a repeat PSA in 6 months - He will see his PCP in 6 months, so we will have him get the PSA  2. BPH WITH LUTS  -DRE normal   PSA in 6 months by PCP  Return in about 1 year (around 09/18/2025) for PSA, I PSS, SHIM, exam .  CLOTILDA CORNWALL, PA-C   [facility] [address], [address] [place], [place] [address] [phone]

## 2024-09-18 ENCOUNTER — Encounter: Payer: Self-pay | Admitting: Urology

## 2024-09-18 ENCOUNTER — Ambulatory Visit: Admitting: Urology

## 2024-09-18 VITALS — BP 146/91 | HR 96 | Ht 69.0 in | Wt 220.0 lb

## 2024-09-18 DIAGNOSIS — N138 Other obstructive and reflux uropathy: Secondary | ICD-10-CM | POA: Diagnosis not present

## 2024-09-18 DIAGNOSIS — R972 Elevated prostate specific antigen [PSA]: Secondary | ICD-10-CM | POA: Diagnosis not present

## 2024-09-18 DIAGNOSIS — N401 Enlarged prostate with lower urinary tract symptoms: Secondary | ICD-10-CM | POA: Diagnosis not present

## 2025-09-10 ENCOUNTER — Other Ambulatory Visit

## 2025-09-17 ENCOUNTER — Ambulatory Visit: Admitting: Urology
